# Patient Record
Sex: Male | Born: 1945
Health system: Southern US, Community
[De-identification: ages and names within clinical notes are randomized; demographics above are authoritative.]

## PROBLEM LIST (undated history)

## (undated) DIAGNOSIS — I10 Essential (primary) hypertension: Secondary | ICD-10-CM

## (undated) DIAGNOSIS — E785 Hyperlipidemia, unspecified: Secondary | ICD-10-CM

## (undated) DIAGNOSIS — N200 Calculus of kidney: Secondary | ICD-10-CM

## (undated) DIAGNOSIS — E119 Type 2 diabetes mellitus without complications: Secondary | ICD-10-CM

## (undated) DIAGNOSIS — I2510 Atherosclerotic heart disease of native coronary artery without angina pectoris: Secondary | ICD-10-CM

## (undated) DIAGNOSIS — M109 Gout, unspecified: Secondary | ICD-10-CM

## (undated) DIAGNOSIS — E039 Hypothyroidism, unspecified: Secondary | ICD-10-CM

## (undated) HISTORY — PX: CORONARY STENT PLACEMENT: SHX1402

## (undated) HISTORY — DX: Essential (primary) hypertension: I10

## (undated) HISTORY — DX: Type 2 diabetes mellitus without complications: E11.9

---

## 1999-03-16 ENCOUNTER — Emergency Department (HOSPITAL_COMMUNITY): Admission: EM | Admit: 1999-03-16 | Discharge: 1999-03-16 | Payer: Self-pay | Admitting: Emergency Medicine

## 2003-01-03 ENCOUNTER — Encounter: Payer: Self-pay | Admitting: Emergency Medicine

## 2003-01-03 ENCOUNTER — Inpatient Hospital Stay (HOSPITAL_COMMUNITY): Admission: EM | Admit: 2003-01-03 | Discharge: 2003-01-05 | Payer: Self-pay | Admitting: Emergency Medicine

## 2003-01-05 ENCOUNTER — Encounter (INDEPENDENT_AMBULATORY_CARE_PROVIDER_SITE_OTHER): Payer: Self-pay | Admitting: Cardiology

## 2009-10-12 ENCOUNTER — Encounter: Admission: RE | Admit: 2009-10-12 | Discharge: 2009-10-12 | Payer: Self-pay | Admitting: Emergency Medicine

## 2011-03-31 NOTE — H&P (Signed)
NAME:  Victor Hutchinson, Victor Hutchinson                        ACCOUNT NO.:  [id number]   MEDICAL RECORD NO.:  [id number]                  PATIENT TYPE:  INP   LOCATION:                                       FACILITY:  [facility]   PHYSICIAN:  Teena Irani. Arlyce Dice, M.D.               DATE OF BIRTH:  January 17, 1946   DATE OF ADMISSION:  01/03/2003  DATE OF DISCHARGE:                                HISTORY & PHYSICAL   CHIEF COMPLAINT:  I'm weak on my left side.   HISTORY OF PRESENT ILLNESS:  This 65 year old male was in his usual state of  health until about 30 hours ago.  At that time, he noted an episode where he  felt numbness on the left side of his body followed by numbness on the left  leg and it lasted a few minutes.  He actually went out to take the dog for a  walk.  He felt if he walked his numbness would go away and in fact it did.  He has had two more milder episodes since then, but then on the evening that  he presented to the emergency room, that is the evening of January 02, 2003, he had a severe episode and came here.  It has now dissipated.  He has  never had this before.  He has no history of cerebrovascular problems.  He  does have a history of hypertension for which he has been treated with  verapamil.  He has not taken it regularly because he does not like the way  it makes him feel.  The only other medicine he takes is Lipitor.  He had a  CT scan that showed a small possible CVA remotely in the frontal lobe.   PAST MEDICAL HISTORY:  Significant in that he had a tonsillectomy and  adenoidectomy in childhood.   ALLERGIES:  He has a possible PENICILLIN allergy.   SOCIAL HISTORY:  He does not smoke or use alcohol.  He has two grown  children and several grandchildren.  He is married.  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   REVIEW OF SYSTEMS:  Noncontributory for the current illness.   PHYSICAL EXAMINATION:   VITAL SIGNS:  Blood pressure 199/107, dropping to  147/97, pulse 88, respiratory rate 18, temperature 97.7 degrees, pulse  oximetry 96%.  GENERAL APPEARANCE:  He is alert, pleasant, and in no distress.  His wife is  present.  HEENT:  Normocephalic.  ACs and TMs clear.  PERRLA.  EOMs intact.  Fundi  benign.  Nares unobstructed.  No septal deviation.  Tongue uncoated.  The  uvula is in the midline.  Teeth are in good repair.  NECK:  No bruits are heard.  CHEST:  Expands symmetrically.  No wheezing, rhonchi, or rales.  HEART:  Normal size  clinically without murmurs, rubs, or gallops.  ABDOMEN:  Obese and soft.  No masses, guarding, tenderness, or organomegaly.  Bowel sounds are intact.  No bruits are heard.  Good peripheral pulses are  intact without edema or skin changes.  GENITALIA:  Deferred.  RECTAL:  Deferred.  CENTRAL NERVOUS SYSTEM:  Cranial nerves II-XII intact.  No gross motor or  sensory deficits.  Cerebellar function intact.  Deep tendon reflexes are  present, equal, and brisk.  SKIN:  Clear without lesions.   IMPRESSION:  1. Episodes of left-sided numbness of uncertain cause.  2. Hypertension.  3. Hyperlipidemia.   PLAN:  1. Admit.  2. Anticoagulate.  3. Assess.                                               Teena Irani. Arlyce Dice, M.D.    DMK/MEDQ  D:  01/03/2003  T:  01/03/2003  Job:  [id number]

## 2011-07-22 ENCOUNTER — Observation Stay (HOSPITAL_COMMUNITY)
Admission: EM | Admit: 2011-07-22 | Discharge: 2011-07-24 | Disposition: A | Discharge status: Home / Self Care | Payer: No Typology Code available for payment source | Source: Ambulatory Visit | Attending: Internal Medicine | Admitting: Internal Medicine

## 2011-07-22 ENCOUNTER — Emergency Department (HOSPITAL_COMMUNITY): Payer: No Typology Code available for payment source

## 2011-07-22 DIAGNOSIS — G459 Transient cerebral ischemic attack, unspecified: Principal | ICD-10-CM | POA: Insufficient documentation

## 2011-07-22 DIAGNOSIS — E781 Pure hyperglyceridemia: Secondary | ICD-10-CM | POA: Insufficient documentation

## 2011-07-22 DIAGNOSIS — E785 Hyperlipidemia, unspecified: Secondary | ICD-10-CM | POA: Insufficient documentation

## 2011-07-22 DIAGNOSIS — E039 Hypothyroidism, unspecified: Secondary | ICD-10-CM | POA: Insufficient documentation

## 2011-07-22 DIAGNOSIS — N183 Chronic kidney disease, stage 3 unspecified: Secondary | ICD-10-CM | POA: Insufficient documentation

## 2011-07-22 DIAGNOSIS — I129 Hypertensive chronic kidney disease with stage 1 through stage 4 chronic kidney disease, or unspecified chronic kidney disease: Secondary | ICD-10-CM | POA: Insufficient documentation

## 2011-07-22 DIAGNOSIS — IMO0001 Reserved for inherently not codable concepts without codable children: Secondary | ICD-10-CM | POA: Insufficient documentation

## 2011-07-22 DIAGNOSIS — Z8673 Personal history of transient ischemic attack (TIA), and cerebral infarction without residual deficits: Secondary | ICD-10-CM | POA: Insufficient documentation

## 2011-07-22 LAB — DIFFERENTIAL
Basophils Absolute: 0.1 10*3/uL (ref 0.0–0.1)
Eosinophils Absolute: 0.2 10*3/uL (ref 0.0–0.7)
Eosinophils Relative: 3 % (ref 0–5)
Lymphocytes Relative: 20 % (ref 12–46)
Lymphs Abs: 1.1 10*3/uL (ref 0.7–4.0)
Monocytes Relative: 9 % (ref 3–12)
Neutro Abs: 3.6 10*3/uL (ref 1.7–7.7)
Neutrophils Relative %: 68 % (ref 43–77)

## 2011-07-22 LAB — COMPREHENSIVE METABOLIC PANEL
ALT: 34 U/L (ref 0–53)
Albumin: 4 g/dL (ref 3.5–5.2)
Alkaline Phosphatase: 92 U/L (ref 39–117)
BUN: 24 mg/dL — ABNORMAL HIGH (ref 6–23)
Chloride: 103 mEq/L (ref 96–112)
GFR calc Af Amer: 56 mL/min — ABNORMAL LOW (ref >60)
Glucose, Bld: 207 mg/dL — ABNORMAL HIGH (ref 70–99)
Potassium: 4 mEq/L (ref 3.5–5.1)
Sodium: 139 mEq/L (ref 135–145)
Total Bilirubin: 0.2 mg/dL — ABNORMAL LOW (ref 0.3–1.2)

## 2011-07-22 LAB — CBC
Hemoglobin: 15 g/dL (ref 13.0–17.0)
MCH: 29 pg (ref 26.0–34.0)
MCV: 81.3 fL (ref 78.0–100.0)
RBC: 5.18 MIL/uL (ref 4.22–5.81)
WBC: 5.4 10*3/uL (ref 4.0–10.5)

## 2011-07-23 ENCOUNTER — Observation Stay (HOSPITAL_COMMUNITY): Payer: No Typology Code available for payment source

## 2011-07-23 DIAGNOSIS — I517 Cardiomegaly: Secondary | ICD-10-CM

## 2011-07-23 DIAGNOSIS — G459 Transient cerebral ischemic attack, unspecified: Secondary | ICD-10-CM

## 2011-07-23 LAB — HEMOGLOBIN A1C: Hgb A1c MFr Bld: 7.4 % — ABNORMAL HIGH (ref <5.7)

## 2011-07-23 LAB — GLUCOSE, CAPILLARY
Glucose-Capillary: 144 mg/dL — ABNORMAL HIGH (ref 70–99)
Glucose-Capillary: 113 mg/dL — ABNORMAL HIGH (ref 70–99)

## 2011-07-23 LAB — LIPID PANEL
HDL: 24 mg/dL — ABNORMAL LOW (ref >39)
LDL Cholesterol: UNDETERMINED mg/dL (ref 0–99)
Total CHOL/HDL Ratio: 8.4 RATIO

## 2011-07-23 LAB — CARDIAC PANEL(CRET KIN+CKTOT+MB+TROPI)
CK, MB: 2.5 ng/mL (ref 0.3–4.0)
Total CK: 48 U/L (ref 7–232)

## 2011-07-23 LAB — TSH: TSH: 12.013 u[IU]/mL — ABNORMAL HIGH (ref 0.350–4.500)

## 2011-07-23 NOTE — H&P (Signed)
NAMESERGE, MAIN NO.:  [id number]  MEDICAL RECORD NO.:  [id number]  LOCATION:  [facility]                         FACILITY:  [facility]  PHYSICIAN:  Seward Speck, MD        DATE OF BIRTH:  07-Nov-1946  DATE OF ADMISSION:  07/22/2011 DATE OF DISCHARGE:                             HISTORY & PHYSICAL   CHIEF COMPLAINT:  Blurry vision.  HISTORY OF PRESENT ILLNESS:  A 65 year old male with history of TIA in 2004 presenting as left upper extremity and lower extremity weakness and numbness, it completely resolved, he presented with the above complaint. The patient was in his normal state of health.  When eating dinner the night of admission, he developed bilateral double vision that became blurry vision.  This lasted several seconds and resolved completely, it then recurred, it then lasted several seconds and then resolved completely.  Currently, he is asymptomatic.  During these spells, he did not have any loss of vision, he did not have any slurred speech, loss of speech, trouble going to use words or weakness or numbness or paresthesias in any extremity.  He felt completely in his normal state of health.  Denied any lightheadedness, syncope, palpitations or any other symptoms during these spells.  He has not had these spells before. His last eye exam was 2 years ago.  He has not had any other ocular symptoms such as eye pain, itchy watery eyes or eye discharge, currently he is asymptomatic.  Several years ago in 2004, he was admitted for workup of a presumed TIA causing left upper extremity and lower extremities symptoms that resolved.  He did not have recurrence of these.  At that time, he had an MRI and MRA of his brain that showed some small vessel disease and negative for any concerning findings on the MRA.  The patient checked his blood sugar and at the time of these symptoms it was in the 140s.  The patient took baby aspirin prior to coming to the emergency  room.  PAST MEDICAL HISTORY: 1. Hypertension, currently on verapamil as monotherapy.  He was on     lisinopril in the past, but was told to stop this at some point for     unknown reasons.  He did not have any side effects of this     medicine that he recalls.  He states his blood pressure home typically runs     150s/80s. 2. Diabetes mellitus type 2 with no documented complications, although     he does have an elevated creatinine suggesting he may have some     diabetic nephropathy. 3. Hyperlipidemia.  The patient was on Lipitor in the past, but has     stopped this because of severe myalgias.  He has not been tried on     another statin. 4. History of TIA in 2004, and is as detailed above.  FAMILY HISTORY:  Lung cancer run in his family, but most of the family are smokers.  SOCIAL HISTORY:  He lives with his wife.  He currently repairs rebuild engines for a job.  He denies tobacco or alcohol use.  REVIEW OF SYSTEMS:  Twelve point review  of systems was reviewed with the patient and is as per HPI, otherwise negative.  ALLERGIES:  PENICILLIN.  MEDICATIONS: 1. Verapamil 240 sustained release once daily. 2. Synthroid unknown dose. 3. As noted above, he had been on lisinopril and Lipitor in the past,     but has stopped these for the reasons stated above.  In addition,     he had been on Plavix in the past, but stopped several years ago as     well for reasons unknown.  Code status: Full  PHYSICAL EXAMINATION:  VITAL SIGNS:  Afebrile, blood pressure currently 168/69, when he came here it was 193/94, pulse 60, respiratory rate 16, oxygen saturation 96% on room air. GENERAL/CONSTITUTIONAL:  Well-developed, overweight male in no acute distress. HEENT:  Moist mucous membranes. NECK:  Supple.  No JVD.  No carotid bruits bilaterally. CARDIOVASCULAR:  Regular rate and rhythm.  No murmurs.  Heart tones soft. PULMONARY:  Clear to auscultation bilaterally. ABDOMEN:  Soft,  nontender. EXTREMITIES:  Warm and well perfused.  No cyanosis, clubbing or edema x4.  Normal pulses throughout. SKIN:  No rash. PSYCH:  Appropriate. NEUROLOGIC:  Alert and oriented x3.  His cranial nerves are intact including normal extraocular movements and pupils are reactive to light bilaterally.  His tongue deviates in the midline.  Facial muscles are normal.  Sensation normal.  His motor exam is 5/5 throughout.  He has no obvious sensory deficits.  His speech is normal.  DIAGNOSTIC STUDIES:  The patient had an EKG, normal sinus rhythm, early transition, first degree AV block, nonspecific T-wave change in aVL. QTC is high normal.  BMP shows creatinine 1.5, BUN 24.  Last creatinine we have is 1.3, this was from 2004; however, troponin negative.  Glucose 207.  CBC normal.  Head CT shows no acute intracranial process likely small chronic lacunar infarct within the anterior aspect of the right external capsule.  ASSESSMENT: 1. Bilateral acute blurry vision, which resolved spontaneously     possibly consistent with transient ischemic attack, although this     will be a slightly odd presentation. 2. Hypertension, currently uncontrolled. 3. Diabetes mellitus type 2, currently uncontrolled. 4. Hyperlipidemia, control unknown. 5. History of transient ischemic attack in the past. 6. History of severe myalgias to Lipitor. 7. CKD II, likely from diabetic nephropathy  PLAN:  We will admit the patient to observation.  We will put him on telemetry to rule out any A Fib as the cause of his possible TIA.  I doubt if MRI/MRA will be helpful in him as he has not had a full stroke and he has had an MRA in the past that showed no vascular abnormality and we are mostly concerned about amaurosis fugax type event thus, we will obtain just carotid Doppler and 2-D echocardiogram for further workup, I think this will be adequate.  We will use ambulation for venous thromboembolism prophylaxis.  We will  put him on sliding scale insulin of moderate intensity.  We will continue his verapamil.  We will start him on lisinopril 10 mg once daily as this will be a good drug for him give his diabetes, likely diabetic nephropathy and poorly controlled hypertension.  We will also place him on Pravachol 40 mg once daily. Again, this is to try different statin and Lipitor as he has had severe myalgias to Lipitor in the past, but the statin is a very good drug for him given his diabetes and history of transient ischemic attacks, it will be  in his best interest to attempt to get another statin as he could tolerate.  We will check a lipid panel as well as an A1c.  We will also check a TSH as people with active hypothyroidism are having increase susceptibility to statin-induced myalgias, thus it will be important to get him euthyroid prior to starting statin to minimize the possibility of myalgias, which may determine from taking this medicine in the future.  I do not know what his current Synthroid dose, neither does he, thus I will not start him on thyroid placement.  Please note that, he does take this chronically and if the dose can be determined, it should be started for him as soon as possible on a daily basis.  We will also check urine microalbumin level in regard to ascertain for any endo-organ damage from his diabetes, so I suspect he has diabetic nephropathy.  We will place him on a baby aspirin.  We will do this instead of Plavix as he was on Plavix in the past, but this was stopped for unknown reasons, thus I am unclear if he had a reaction to Plavix in the past.  Begin on a baby aspirin is a good start for him given the possible diagnosis of acute TIA and the definite history of a prior TIA.  Plavix is a good alternative if it is determined this is the safe drug given his history of being taken off this for unknown reasons.  He will be a full code per his request.  He did confirm he did not  want any prolonged life support.          ______________________________ Seward Speck, MD     DP/MEDQ  D:  07/23/2011  T:  07/23/2011  Job:  [id number]  Electronically Signed by Seward Speck MD on 07/23/2011 03:40:05 AM

## 2011-07-24 LAB — BASIC METABOLIC PANEL
BUN: 22 mg/dL (ref 6–23)
CO2: 24 mEq/L (ref 19–32)
Calcium: 9.5 mg/dL (ref 8.4–10.5)
Chloride: 104 mEq/L (ref 96–112)
Creatinine, Ser: 1.36 mg/dL — ABNORMAL HIGH (ref 0.50–1.35)
Glucose, Bld: 176 mg/dL — ABNORMAL HIGH (ref 70–99)

## 2011-07-24 LAB — GLUCOSE, CAPILLARY: Glucose-Capillary: 166 mg/dL — ABNORMAL HIGH (ref 70–99)

## 2011-08-11 NOTE — Discharge Summary (Signed)
NAMEJONERIC, Victor Hutchinson              ACCOUNT NO.:  [id number]  MEDICAL RECORD NO.:  [id number]  LOCATION:  3016                         FACILITY:  [facility]  PHYSICIAN:  Richarda Overlie, MD       DATE OF BIRTH:  Oct 19, 1946  DATE OF ADMISSION:  07/22/2011 DATE OF DISCHARGE:  07/24/2011                              DISCHARGE SUMMARY   PRIMARY CARE PHYSICIAN:  Dr. Andrey Campanile.  DISCHARGE DIAGNOSES: 1. Transient ischemic attack. 2. Hypothyroidism. 3. Uncontrolled type 2 diabetes. 4. Uncontrolled hypertension. 5. Hypertriglyceridemia.  DISCHARGE MEDICATIONS: 1. Aspirin 81 mg p.o. daily. 2. Glipizide 5 mg p.o. daily. 3. Lisinopril 20 mg p.o. daily. 4. Omega-3 fatty acid 4 g p.o. daily. 5. Crestor 10 mg p.o. daily. 6. Synthroid 25 mcg p.o. daily. 7. Verapamil SR 240 mg p.o. daily.  SUBJECTIVE:  This is a 65 year old male who presents with symptoms of double vision with a prior history of CVA.  The patient's symptoms completely resolved upon presentation to the ER.  He denied any chest pain, shortness of breath, any lightheadedness.  He was admitted for further evaluation.  HOSPITAL COURSE: 1. TIA.  The patient was found to have uncontrolled hypertension, was     evaluated for risk factors and his lipid panel showed that his     triglycerides were 552, his LDL was unable to be calculated because     his triglycerides were over 400 and his HDL was 24.  His hemoglobin     A1c was found to be 7.4.  His initial CT of the head without     contrast did not show any acute intracranial pathology, but showed     small chronic lacunar infarcts within the anterior aspect of the     right external capsule.  MRI of the brain did not show any acute     findings, but he was found to have a small old left posterior     parietal cortical infarction.  A 2-D echo was done, the results of     which are pending at this time.  The patient was found to be normal     sinus rhythm without any underlying  arrhythmias.  He used to be on     Plavix which he stopped several years ago.  The patient is being     resumed on a baby aspirin.  He has been counseled about risk factor     modification. 2. Hypertension.  The patient will require an ACE inhibitor as well as     verapamil for his hypertension.  His blood pressure was found to be     in the 190s systolic upon presentation.  He would need a repeat     BMET in about 1-2 weeks to ensure that his creatinine and his     potassium are stable. 3. Dyslipidemia.  The patient was found to have hypertriglyceridemia     and has been started on Crestor and Lovaza.  He was intolerant of     Lipitor previously.  He would need a repeat lipid panel in about 6     weeks. 4. Hypothyroidism.  The patient was found to have clinical signs  and     symptoms of hypothyroidism.  His free T4 is still pending at this     time.  His TSH was found to be 12.013.  He will be restarted on     Synthroid 25 mcg and he will have a recheck of his TSH thyroid     function in about 6 weeks.  PHYSICAL EXAMINATION:  VITAL SIGNS:  Blood pressure 172/71, respirations 18, pulse 66, temperature 98.0, 96% on room air. GENERAL:  Currently comfortable in no acute cardiopulmonary distress. HEENT:  Pupils equal and reactive.  Extraocular movements intact. NECK:  Supple.  No JVD. LUNGS:  Clear to auscultation bilaterally.  No wheezes, no crackles or rhonchi. CARDIOVASCULAR:  Regular rate and rhythm.  No murmurs, rubs, or gallops. ABDOMEN:  Soft, nontender, nondistended. EXTREMITIES:  Without cyanosis, clubbing, or edema. NEUROLOGIC:  Cranial nerves II through XII grossly intact. PSYCHIATRIC:  Appropriate mood and affect.  FOLLOWUP:  TSH, lipid panel in about 6 weeks.  Follow up with PCP in 5-7 days.     Richarda Overlie, MD     NA/MEDQ  D:  07/24/2011  T:  07/24/2011  Job:  [id number]  cc:   Dr. Andrey Campanile  Electronically Signed by Richarda Overlie MD on 08/11/2011 07:15:26 AM

## 2013-06-06 ENCOUNTER — Ambulatory Visit (INDEPENDENT_AMBULATORY_CARE_PROVIDER_SITE_OTHER): Payer: PRIVATE HEALTH INSURANCE | Admitting: Emergency Medicine

## 2013-06-06 ENCOUNTER — Encounter (HOSPITAL_COMMUNITY): Payer: Self-pay | Admitting: *Deleted

## 2013-06-06 ENCOUNTER — Inpatient Hospital Stay (HOSPITAL_COMMUNITY): Payer: No Typology Code available for payment source

## 2013-06-06 ENCOUNTER — Ambulatory Visit: Payer: PRIVATE HEALTH INSURANCE

## 2013-06-06 ENCOUNTER — Inpatient Hospital Stay (HOSPITAL_COMMUNITY)
Admission: AD | Admit: 2013-06-06 | Discharge: 2013-06-08 | DRG: 690 | Disposition: A | Discharge status: Home / Self Care | Payer: No Typology Code available for payment source | Source: Ambulatory Visit | Attending: Family Medicine | Admitting: Family Medicine

## 2013-06-06 VITALS — BP 122/68 | HR 80 | Temp 98.0°F | Resp 16 | Ht 70.0 in | Wt 202.0 lb

## 2013-06-06 DIAGNOSIS — Z87440 Personal history of urinary (tract) infections: Secondary | ICD-10-CM

## 2013-06-06 DIAGNOSIS — R300 Dysuria: Secondary | ICD-10-CM

## 2013-06-06 DIAGNOSIS — N200 Calculus of kidney: Secondary | ICD-10-CM

## 2013-06-06 DIAGNOSIS — N138 Other obstructive and reflux uropathy: Secondary | ICD-10-CM

## 2013-06-06 DIAGNOSIS — I10 Essential (primary) hypertension: Secondary | ICD-10-CM

## 2013-06-06 DIAGNOSIS — N401 Enlarged prostate with lower urinary tract symptoms: Secondary | ICD-10-CM

## 2013-06-06 DIAGNOSIS — E785 Hyperlipidemia, unspecified: Secondary | ICD-10-CM | POA: Diagnosis present

## 2013-06-06 DIAGNOSIS — N390 Urinary tract infection, site not specified: Secondary | ICD-10-CM

## 2013-06-06 DIAGNOSIS — Z87442 Personal history of urinary calculi: Secondary | ICD-10-CM

## 2013-06-06 DIAGNOSIS — I129 Hypertensive chronic kidney disease with stage 1 through stage 4 chronic kidney disease, or unspecified chronic kidney disease: Secondary | ICD-10-CM | POA: Diagnosis present

## 2013-06-06 DIAGNOSIS — M109 Gout, unspecified: Secondary | ICD-10-CM

## 2013-06-06 DIAGNOSIS — E119 Type 2 diabetes mellitus without complications: Secondary | ICD-10-CM | POA: Diagnosis present

## 2013-06-06 DIAGNOSIS — E039 Hypothyroidism, unspecified: Secondary | ICD-10-CM | POA: Diagnosis present

## 2013-06-06 DIAGNOSIS — N183 Chronic kidney disease, stage 3 unspecified: Secondary | ICD-10-CM | POA: Diagnosis present

## 2013-06-06 DIAGNOSIS — N289 Disorder of kidney and ureter, unspecified: Secondary | ICD-10-CM

## 2013-06-06 DIAGNOSIS — I119 Hypertensive heart disease without heart failure: Secondary | ICD-10-CM | POA: Diagnosis present

## 2013-06-06 HISTORY — DX: Calculus of kidney: N20.0

## 2013-06-06 HISTORY — DX: Hyperlipidemia, unspecified: E78.5

## 2013-06-06 HISTORY — DX: Gout, unspecified: M10.9

## 2013-06-06 HISTORY — DX: Hypothyroidism, unspecified: E03.9

## 2013-06-06 LAB — COMPREHENSIVE METABOLIC PANEL
ALT: 9 U/L (ref 0–53)
AST: 15 U/L (ref 0–37)
AST: 12 U/L (ref 0–37)
Albumin: 4 g/dL (ref 3.5–5.2)
Albumin: 3.4 g/dL — ABNORMAL LOW (ref 3.5–5.2)
Alkaline Phosphatase: 85 U/L (ref 39–117)
Calcium: 9.5 mg/dL (ref 8.4–10.5)
Calcium: 9.6 mg/dL (ref 8.4–10.5)
Chloride: 104 mEq/L (ref 96–112)
Creatinine, Ser: 1.62 mg/dL — ABNORMAL HIGH (ref 0.50–1.35)
Glucose, Bld: 108 mg/dL — ABNORMAL HIGH (ref 70–99)
Potassium: 4.1 mEq/L (ref 3.5–5.3)
Sodium: 138 mEq/L (ref 135–145)
Sodium: 137 mEq/L (ref 135–145)
Total Protein: 7.5 g/dL (ref 6.0–8.3)

## 2013-06-06 LAB — GRAM STAIN

## 2013-06-06 LAB — CBC WITH DIFFERENTIAL/PLATELET
Eosinophils Absolute: 0 10*3/uL (ref 0.0–0.7)
Eosinophils Relative: 0 % (ref 0–5)
HCT: 38.5 % — ABNORMAL LOW (ref 39.0–52.0)
Hemoglobin: 13.2 g/dL (ref 13.0–17.0)
Lymphs Abs: 0.6 10*3/uL — ABNORMAL LOW (ref 0.7–4.0)
MCH: 27.3 pg (ref 26.0–34.0)
MCV: 79.7 fL (ref 78.0–100.0)
Monocytes Absolute: 1.6 10*3/uL — ABNORMAL HIGH (ref 0.1–1.0)
Monocytes Relative: 9 % (ref 3–12)
RBC: 4.83 MIL/uL (ref 4.22–5.81)

## 2013-06-06 LAB — POCT URINALYSIS DIPSTICK
Glucose, UA: NEGATIVE
Ketones, UA: 15
Nitrite, UA: NEGATIVE
Protein, UA: 30
Spec Grav, UA: 1.02
Urobilinogen, UA: 1
pH, UA: 6

## 2013-06-06 LAB — URINE MICROSCOPIC-ADD ON

## 2013-06-06 LAB — POCT UA - MICROSCOPIC ONLY
Crystals, Ur, HPF, POC: NEGATIVE
Mucus, UA: NEGATIVE
Yeast, UA: NEGATIVE

## 2013-06-06 LAB — URINALYSIS, ROUTINE W REFLEX MICROSCOPIC
Bilirubin Urine: NEGATIVE
Glucose, UA: NEGATIVE mg/dL
Nitrite: POSITIVE — AB
Specific Gravity, Urine: 1.018 (ref 1.005–1.030)
pH: 6.5 (ref 5.0–8.0)

## 2013-06-06 LAB — POCT CBC
Granulocyte percent: 88.9 %G — AB (ref 37–80)
HCT, POC: 43.6 % (ref 43.5–53.7)
Hemoglobin: 13.8 g/dL — AB (ref 14.1–18.1)
Lymph, poc: 0.9 (ref 0.6–3.4)
MCV: 85.3 fL (ref 80–97)
POC Granulocyte: 14.8 — AB (ref 2–6.9)

## 2013-06-06 LAB — GLUCOSE, POCT (MANUAL RESULT ENTRY): POC Glucose: 102 mg/dl — AB (ref 70–99)

## 2013-06-06 LAB — URIC ACID: Uric Acid, Serum: 7.8 mg/dL (ref 4.0–7.8)

## 2013-06-06 LAB — GLUCOSE, CAPILLARY: Glucose-Capillary: 157 mg/dL — ABNORMAL HIGH (ref 70–99)

## 2013-06-06 MED ORDER — HEPARIN SODIUM (PORCINE) 5000 UNIT/ML IJ SOLN
5000.0000 [IU] | Freq: Three times a day (TID) | INTRAMUSCULAR | Status: DC
  Administered 2013-06-06 – 2013-06-08 (×5): 5000 [IU] via SUBCUTANEOUS
  Filled 2013-06-06 (×8): qty 1

## 2013-06-06 MED ORDER — DOCUSATE SODIUM 100 MG PO CAPS
100.0000 mg | ORAL_CAPSULE | Freq: Two times a day (BID) | ORAL | Status: DC
  Administered 2013-06-06 – 2013-06-07 (×2): 100 mg via ORAL
  Filled 2013-06-06 (×3): qty 1

## 2013-06-06 MED ORDER — LEVOTHYROXINE SODIUM 25 MCG PO TABS
25.0000 ug | ORAL_TABLET | Freq: Every day | ORAL | Status: DC
  Administered 2013-06-07 – 2013-06-08 (×2): 25 ug via ORAL
  Filled 2013-06-06 (×3): qty 1

## 2013-06-06 MED ORDER — DEXTROSE 5 % IV SOLN: 1.0000 g | INTRAVENOUS | Status: DC

## 2013-06-06 MED ORDER — MORPHINE SULFATE 4 MG/ML IJ SOLN
4.0000 mg | INTRAMUSCULAR | Status: DC | PRN
  Administered 2013-06-06: 4 mg via INTRAVENOUS
  Filled 2013-06-06: qty 1

## 2013-06-06 MED ORDER — DEXTROSE 5 % IV SOLN
1.0000 g | INTRAVENOUS | Status: DC
  Administered 2013-06-06 – 2013-06-07 (×2): 1 g via INTRAVENOUS
  Filled 2013-06-06 (×4): qty 10

## 2013-06-06 MED ORDER — INSULIN ASPART 100 UNIT/ML ~~LOC~~ SOLN
0.0000 [IU] | Freq: Three times a day (TID) | SUBCUTANEOUS | Status: DC
  Administered 2013-06-07: 1 [IU] via SUBCUTANEOUS
  Administered 2013-06-07: 2 [IU] via SUBCUTANEOUS
  Administered 2013-06-07 – 2013-06-08 (×2): 1 [IU] via SUBCUTANEOUS

## 2013-06-06 MED ORDER — ONDANSETRON HCL 4 MG/2ML IJ SOLN
4.0000 mg | Freq: Four times a day (QID) | INTRAMUSCULAR | Status: DC | PRN
  Administered 2013-06-07: 4 mg via INTRAVENOUS
  Filled 2013-06-06: qty 2

## 2013-06-06 MED ORDER — VERAPAMIL HCL ER 240 MG PO TBCR
240.0000 mg | EXTENDED_RELEASE_TABLET | Freq: Every morning | ORAL | Status: DC
Start: 2013-06-07 — End: 2013-06-08
  Administered 2013-06-07 – 2013-06-08 (×2): 240 mg via ORAL
  Filled 2013-06-06 (×2): qty 1

## 2013-06-06 MED ORDER — SODIUM CHLORIDE 0.45 % IV SOLN
INTRAVENOUS | Status: DC
  Administered 2013-06-07 – 2013-06-08 (×3): via INTRAVENOUS

## 2013-06-06 MED ORDER — ONDANSETRON HCL 4 MG PO TABS: 4.0000 mg | ORAL_TABLET | Freq: Four times a day (QID) | ORAL | Status: DC | PRN

## 2013-06-06 MED ORDER — PROBENECID 500 MG PO TABS
250.0000 mg | ORAL_TABLET | Freq: Two times a day (BID) | ORAL | Status: DC
  Administered 2013-06-06 – 2013-06-08 (×4): 250 mg via ORAL
  Filled 2013-06-06 (×5): qty 1

## 2013-06-06 MED ORDER — ACETAMINOPHEN 325 MG PO TABS
650.0000 mg | ORAL_TABLET | Freq: Four times a day (QID) | ORAL | Status: DC | PRN
  Administered 2013-06-07: 650 mg via ORAL
  Filled 2013-06-06: qty 2

## 2013-06-06 MED ORDER — DEXTROSE 5 % IV SOLN: 2.0000 g | INTRAVENOUS | Status: DC

## 2013-06-06 NOTE — H&P (Signed)
Family Medicine Teaching [facility] Admission History and Physical Service Pager: [phone]  Patient name: Victor Hutchinson Medical record number: [id number] Date of birth: Oct 16, 1946 Age: 67 y.o. Gender: male  Primary Care Provider: Dr. Benedetto Goad in [place] Consultants: None Code Status: Full  Chief Complaint: Dysuria, weakness and fever.   Assessment and Plan: Victor Hutchinson is a 67 y.o. year old male presenting with dysuria and urinary urgency x2 days. PMH is significant for kidney stones, HTN, and DM, and gout.      Urinary tract infection U/A in clinic: WBC TNTC/HPF and bacteria 3+. WBCs 16.6 in clinic. Currently afebrile, VSS wnl. Previous UTI treated with cipro po approximately 4 years ago. - U/A, urine culture with gram stain.  - Starting Rocephin 1g daily.   Nephrolithiasis Abd XR with left abdominal densities suspicious for nephrolithiasis.  Pt still with severe suprapubic pain. Possibly pain simply due to UTI, though with WBC 16k, concern for development of sepsis from nephrolithiasis though does not meet SIRS criteria at present.  - CT Abdomen w/o contrast to further assess size/location of stones. If obstructing or >67mm, will consult urology.  CT also to evaluate for other etiology including appendicitis though benign abdominal exam.  - 1/2NS @ 125cc/hr - Morphine 4mg  q4h prn pain - ? Baseline kidney function with last known Cr 2.86. Attempted to access careeverywhere for [facility] [place] and unsuccessful. BMET ordered.  -would consider flomax  Type II diabetes mellitus Treats diabetes with diet and glipizide. Metformin has not been used because of kidney function reportedly. [ ]  Hb A1c - Start sensitive SSI. Hold glipizide.  - Carb modified diet   Gout Continue home probenecid. [ ]  Uric acid level  HTN Continue home verapamil, this may be therapeutic for stone passage.   Hyperlipidemia Could not tolerate lipitor or crestor.  [ ]  Lipid panel - Will  consider more mild statin if indicated.   Hypothyroidism continue levothyroxine.   FEN/GI: 1/2NS @100cc /hr, Carb modified Prophylaxis: Heparin subcutaneous  Disposition: On Med-surg floor pending further evaluation/treatment  History of Present Illness: Victor Hutchinson is a 67 y.o. year old male presented to [facility] (PCP: [place], [place]) with 2 days of urinary urgency and dysuria assoc w/ weakness and subjective fevers suspicious for kidney stone/pyelonephritis. Dr. Cleta Alberts had the patient transferred to hospital for further care.   Pain is mostly suprapubic and associated with nausea without emesis, no groin/abd pain, no CVA or back pain. Denies any decreased po intake recently. He has noticed no gross blood in his urine.  He states he has had this same feeling at another time when he had a kidney stone, at which time he was treated for UTI with cipro as an outpatient.  He has seen a urologist once with no intervention or follow-up recommended at that time. He remembers passing the stone at that time. Does not recall any dietary advice to prevent future stone formations.  Symptoms gradually progressive. Generalized weakness was noted today.    Review Of Systems: Per HPI. Otherwise 12 point review of systems was performed and was unremarkable.  Patient Active Problem List   Diagnosis Date Noted  . UTI (urinary tract infection) 06/06/2013  . Nephrolithiasis 06/06/2013  . Type II diabetes mellitus 06/06/2013  . Essential hypertension, benign 06/06/2013  . Other and unspecified hyperlipidemia 06/06/2013   Past Medical History: Past Medical History  Diagnosis Date  . Diabetes mellitus without complication   . Hypertension   . Hypothyroidism   .  Gout 06/06/2013  . Nephrolithiasis 06/06/2013  . Other and unspecified hyperlipidemia 06/06/2013   Past Surgical History: Only tonsillectomy. No appendectomy or cholecystectomy.  Social History: History  Substance Use Topics  . Smoking  status: Never Smoker   . Smokeless tobacco: Not on file  . 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   Family History: Family History  Problem Relation Age of Onset  . Heart disease Sister   Sister had CABG at 42yo.   Allergies and Medications: No Known Allergies No current facility-administered medications on file prior to encounter.   Current Outpatient Prescriptions on File Prior to Encounter  Medication Sig Dispense Refill  . glipiZIDE (GLUCOTROL) 5 MG tablet Take 5 mg by mouth 2 (two) times daily before a meal.      . levothyroxine (LEVOTHROID) 25 MCG tablet Take 25 mcg by mouth daily before breakfast.      . naproxen sodium (ANAPROX) 220 MG tablet Take 220 mg by mouth 2 (two) times daily with a meal.      . probenecid (BENEMID) 500 MG tablet Take 500 mg by mouth 2 (two) times daily.      . verapamil (CALAN-SR) 240 MG CR tablet Take 240 mg by mouth at bedtime.        Objective: BP 148/74  Pulse 81  Temp(Src) 99.7 F (37.6 C) (Oral)  Resp 18  Ht 5\' 10"  (1.778 m)  Wt 197 lb 1.5 oz (89.4 kg)  BMI 28.28 kg/m2  SpO2 98% Exam: Gen: 67yo male in NAD laying still.  HEENT: [place]/AT Dentures on hard palate CV: RRR no mrg  Lungs: CTAB  Abd: soft/nontender/nondistended/normal bowel sounds  MSK: moves all extremities, no edema  Skin: warm and dry, no rash, + Vitiligo Neuro: CN II-XII intact, sensation and reflexes normal throughout, 5/5 muscle strength in bilateral upper and lower extremities. Normal finger to nose. Normal rapid alternating movements.   Labs and Imaging: CBC BMET   Recent Labs Lab 06/06/13 1459  WBC 16.6*  HGB 13.8*  HCT 43.6   No results found for this basename: NA, K, CL, CO2, BUN, CREATININE, GLUCOSE, CALCIUM,  in the last 168 hours    06/06/2013 14:52  Color, UA yellow  Specific Gravity, UA 1.020  pH, UA 6.0  Glucose neg  Bilirubin, UA small  Ketones, UA 15  Clarity, UA cloudy  Protein, UA 30   Urobilinogen, UA 1.0  Nitrite, UA neg  Leukocytes, UA large (3+)  RBC, UA small  Mucus, UA neg  Yeast, UA neg  Epithelial cells, urine per micros 0-3  Casts, Ur, LPF, POC white  Crystals, Ur, HPF, POC neg  WBC, Ur, HPF, POC tntc  Bacteria, U Microscopic 3+  RBC, urine, microscopic tntc   7/25: Abd XR Findings: There are two oval calcific densities overlying the left  renal shadow. The larger more superior calcification measures 11  mm in diameter. No calcifications are seen over the right kidney  or expected course of the ureters. There is a possible small left  upper quadrant calcification which could be related to the spleen.  The bowel gas pattern is normal. The osseous structures appear  normal.  IMPRESSION:  Left abdominal densities suspicious for nephrolithiasis.   Hazeline Junker, MD 06/06/2013, 3:35 PM PGY-1, [facility] Family Medicine FPTS Intern pager: [phone], text pages welcome  Family Medicine Upper Level Addendum:   I have seen and examined the patient independently, discussed with Dr. Jarvis Newcomer,  fully reviewed the H+P and agree with it's contents with the additions as noted in blue text.    Tana Conch, MD, PGY3 06/06/2013 8:53 PM

## 2013-06-06 NOTE — Progress Notes (Signed)
Subjective:    Patient ID: Victor Hutchinson, male    DOB: 1946-08-13, 67 y.o.   MRN: [id number]  HPI patient states that on Wednesday he started having burning on urination he also has had the urge to urinate. He states he does not feel he empties his bladder completely. He has been weak and tired but no definite fever. Patient has a history of diabetes and gout. He has a history of kidney stones. He states he feels like he has in the past when he has had stone.    Review of Systems he has a history of diabetes     Objective:   Physical Exam patient is alert and cooperative in no distress. Neck supple chest clear heart regular rate no murmurs. Abdomen is flat liver and spleen not large. There is suprapubic tenderness. There is no rebound Results for orders placed in visit on 06/06/13  POCT URINALYSIS DIPSTICK      Result Value Range   Color, UA yellow     Clarity, UA cloudy     Glucose, UA neg     Bilirubin, UA small     Ketones, UA 15     Spec Grav, UA 1.020     Blood, UA small     pH, UA 6.0     Protein, UA 30     Urobilinogen, UA 1.0     Nitrite, UA neg     Leukocytes, UA large (3+)    POCT UA - MICROSCOPIC ONLY      Result Value Range   WBC, Ur, HPF, POC tntc     RBC, urine, microscopic tntc     Bacteria, U Microscopic 3+     Mucus, UA neg     Epithelial cells, urine per micros 0-3     Crystals, Ur, HPF, POC neg     Casts, Ur, LPF, POC white     Yeast, UA neg    POCT CBC      Result Value Range   WBC 16.6 (*) 4.6 - 10.2 K/uL   Lymph, poc 0.9  0.6 - 3.4   POC LYMPH PERCENT 5.6 (*) 10 - 50 %L   MID (cbc) 0.9  0 - 0.9   POC MID % 5.5  0 - 12 %M   POC Granulocyte 14.8 (*) 2 - 6.9   Granulocyte percent 88.9 (*) 37 - 80 %G   RBC 5.11  4.69 - 6.13 M/uL   Hemoglobin 13.8 (*) 14.1 - 18.1 g/dL   HCT, POC 86.5  78.4 - 53.7 %   MCV 85.3  80 - 97 fL   MCH, POC 27.0  27 - 31.2 pg   MCHC 31.7 (*) 31.8 - 35.4 g/dL   RDW, POC 69.6     Platelet Count, POC 217  142 - 424 K/uL   MPV 7.4  0 - 99.8 fL  GLUCOSE, POCT (MANUAL RESULT ENTRY)      Result Value Range   POC Glucose 102 (*) 70 - 99 mg/dl   Results for orders placed in visit on 06/06/13  POCT URINALYSIS DIPSTICK      Result Value Range   Color, UA yellow     Clarity, UA cloudy     Glucose, UA neg     Bilirubin, UA small     Ketones, UA 15     Spec Grav, UA 1.020     Blood, UA small     pH, UA 6.0  Protein, UA 30     Urobilinogen, UA 1.0     Nitrite, UA neg     Leukocytes, UA large (3+)    POCT UA - MICROSCOPIC ONLY      Result Value Range   WBC, Ur, HPF, POC tntc     RBC, urine, microscopic tntc     Bacteria, U Microscopic 3+     Mucus, UA neg     Epithelial cells, urine per micros 0-3     Crystals, Ur, HPF, POC neg     Casts, Ur, LPF, POC white     Yeast, UA neg    POCT CBC      Result Value Range   WBC 16.6 (*) 4.6 - 10.2 K/uL   Lymph, poc 0.9  0.6 - 3.4   POC LYMPH PERCENT 5.6 (*) 10 - 50 %L   MID (cbc) 0.9  0 - 0.9   POC MID % 5.5  0 - 12 %M   POC Granulocyte 14.8 (*) 2 - 6.9   Granulocyte percent 88.9 (*) 37 - 80 %G   RBC 5.11  4.69 - 6.13 M/uL   Hemoglobin 13.8 (*) 14.1 - 18.1 g/dL   HCT, POC 21.3  08.6 - 53.7 %   MCV 85.3  80 - 97 fL   MCH, POC 27.0  27 - 31.2 pg   MCHC 31.7 (*) 31.8 - 35.4 g/dL   RDW, POC 57.8     Platelet Count, POC 217  142 - 424 K/uL   MPV 7.4  0 - 99.8 fL  GLUCOSE, POCT (MANUAL RESULT ENTRY)      Result Value Range   POC Glucose 102 (*) 70 - 99 mg/dl   [facility] reading (PRIMARY) by  Dr. Cleta Alberts there are 2 calcific densities in the left kidney consistent with stones       Assessment & Plan:  Patient has significant elevation in his white count. His last BUN and creatinine were significantly  with a creatinine of 2.86 on 08/07/2010 . He does have significant gout with multiple tophi on his elbows and hands. He had chills last night I'm concerned he is headed toward urosepsis

## 2013-06-07 DIAGNOSIS — N401 Enlarged prostate with lower urinary tract symptoms: Secondary | ICD-10-CM

## 2013-06-07 DIAGNOSIS — N138 Other obstructive and reflux uropathy: Secondary | ICD-10-CM

## 2013-06-07 LAB — BASIC METABOLIC PANEL
BUN: 26 mg/dL — ABNORMAL HIGH (ref 6–23)
Chloride: 102 mEq/L (ref 96–112)
Creatinine, Ser: 1.68 mg/dL — ABNORMAL HIGH (ref 0.50–1.35)
GFR calc Af Amer: 47 mL/min — ABNORMAL LOW (ref >90)

## 2013-06-07 LAB — HEMOGLOBIN A1C: Mean Plasma Glucose: 148 mg/dL — ABNORMAL HIGH (ref <117)

## 2013-06-07 LAB — CBC
HCT: 36.3 % — ABNORMAL LOW (ref 39.0–52.0)
MCHC: 33.3 g/dL (ref 30.0–36.0)
MCV: 80.3 fL (ref 78.0–100.0)
RDW: 14.9 % (ref 11.5–15.5)

## 2013-06-07 LAB — GLUCOSE, CAPILLARY

## 2013-06-07 MED ORDER — TAMSULOSIN HCL 0.4 MG PO CAPS
0.4000 mg | ORAL_CAPSULE | Freq: Every day | ORAL | Status: DC
  Administered 2013-06-07: 0.4 mg via ORAL
  Filled 2013-06-07 (×2): qty 1

## 2013-06-07 NOTE — Progress Notes (Signed)
Seen and examined.  Discussed with Dr. Durene Cal.  Agree with management.  Reportedly, PVR normal.  But he was having urinary hesitancy and decreased stream prior to infection.  It sounds like he will benefit from flomax, which he has taken in the past during a previous UTI.  Flomax should become a chronic med for him

## 2013-06-07 NOTE — Progress Notes (Signed)
Family Medicine Teaching Service Daily Progress Note Intern Pager: [phone]  Patient name: Victor Hutchinson Medical record number: [id number] Date of birth: [birth year] Age: 67 y.o. Gender: male  Primary Care Provider: Unassigned/[facility] Dr. Andrey Campanile Consultants: None Code Status: Full Code  Pt Overview and Major Events to Date:  67 year old male who presented with leukocytosis, weakness, suprapubic pain, dysurai, and urinary urgency for 2 days and UA concerning for UTI. Reportedly with negative prostate exam at [facility].  *7/25 CT abd/pelvis: no obstructing stone.   Assessment and Plan:  # Urinary tract infection WBC stable 17k (febrile o/n x 1). Urine gram stain with GNR. Vss stable. Normal prostate exam in office so doubt prostatitis. CT without appendicitis or obvious diverticulitis.  -much improved per patient (no pain and no weakness) -continue treatment with Ceftriaxone 1g daily -given febrile o/n, WBC 17k, and urine culture not available, plan to observe overnight and continue IV ABX -tylenol for pain, morphine made him feel nauseous [ ]  f/u urine culture  # Nephrolithiasis  -Per CT: No ureterolithiasis. No obstructing stone. Possibly could have passed a stone.  -No flomax or urology consult at present.   # CKD Stage III -continue to monitor BMET. Stable in baseline 1.5-1.7.  [ ]  PVR  # Type II diabetes mellitus A1c 6.8 with diet and glipizide.  -  sensitive SSI. Hold glipizide.  - Carb modified diet   # Gout Continue home probenecid. Could stop if GFR worsened. Uric acid above goal for gout at 7.8 but will not alter home therapy at present.   # HTN controlle.d Continue home verapamill.  passage.   # Hyperlipidemia Could not tolerate lipitor or crestor. No medications at present.   # Hypothyroidism continue levothyroxine.   FEN/GI: 1/2NS @50cc /hr, Carb modified Likely d/c fluids within 24 hours Prophylaxis: Heparin subcutaneous   Disposition: if  continued improvement and afebrile, likely d/c 7/27.   Subjective: Feels much better, no pain, weakness has resolved. Morphine was too strong.   Objective: Temp:  [98 F (36.7 C)-100.4 F (38 C)] 98.4 F (36.9 C) (07/26 0517) Pulse Rate:  [80-91] 86 (07/26 0517) Resp:  [16-18] 18 (07/26 0517) BP: (122-148)/(63-74) 132/71 mmHg (07/26 0517) SpO2:  [95 %-98 %] 96 % (07/26 0517) Weight:  [197 lb 1.5 oz (89.4 kg)-202 lb (91.627 kg)] 197 lb 1.5 oz (89.4 kg) (07/25 1615) Physical Exam: Gen: 67yo male in NAD laying still.  HEENT: [place]/AT Dentures on hard palate  CV: RRR no mrg  Lungs: CTAB  Abd: soft/nontender/nondistended/normal bowel sounds  MSK: moves all extremities, no edema, No CVA tenderness Skin: warm and dry, no rash, + Vitiligo  Neuro: grossly normal, alert and oriented x3.   Laboratory:  Recent Labs Lab 06/06/13 1459 06/06/13 2001 06/07/13 0540  WBC 16.6* 17.0* 17.1*  HGB 13.8* 13.2 12.1*  HCT 43.6 38.5* 36.3*  PLT  --  166 153    Recent Labs Lab 06/06/13 1451 06/06/13 2001 06/07/13 0540  NA 138 137 136  K 4.1 3.9 4.0  CL 104 102 102  CO2 20 24 23   BUN 28* 26* 26*  CREATININE 1.80* 1.62* 1.68*  CALCIUM 9.5 9.6 9.1  PROT 7.5 7.6  --   BILITOT 0.8 0.7  --   ALKPHOS 85 90  --   ALT 11 9  --   AST 15 12  --   GLUCOSE 108* 134* 159*    Urine culture-pending  Imaging/Diagnostic Tests: Ct Abdomen Pelvis Wo Contrast  06/06/2013   *RADIOLOGY REPORT*  Clinical Data: Symptomatic nephrolithiasis  CT ABDOMEN AND PELVIS WITHOUT CONTRAST  Technique:  Multidetector CT imaging of the abdomen and pelvis was performed following the standard protocol without intravenous contrast.  Comparison: CT abdomen pelvis - 10/04/2009  Findings:  The lack of intravenous contrast limits the ability to evaluate solid abdominal organs.  There are bilateral nonobstructing renal stones. Dominant stone within the superior pole of the left kidney measures approximately 1.0 x 0.8 cm (image 47,  series 2) while and additional dominant stone within the inferior pole of the left kidney measures approximate 1.2 x 0.8 cm (image 56, series 2).  There is a punctate (approximately 4 mm) nonobstructing stone within the inferior pole of the right kidney (image 48, series 2). There are additional smaller punctate (approximately 2 mm) stones within the superior pole of the right kidney (coronal image 73) as well as the mid aspect of the right kidney (coronal image 68).  No stones are seen along the expected course of either ureter or within the urinary bladder.  Normal noncontrast appearance of the urinary bladder giving degree of distension.  Grossly symmetric likely age related perinephric stranding.  No urinary obstruction.  ----------------------------------------------------------  Normal hepatic contour.  There is an approximately 1.7 x 1.0 cm stone within the otherwise normal appearing gallbladder.  No definite gallbladder wall thickening or pericholecystic fluid.  No ascites.  Normal noncontrast appearance of the bilateral adrenal glands, pancreas and spleen.  Extensive colonic diverticulosis without evidence of diverticulitis on this noncontrast examination.  The bowel is otherwise normal in course and caliber without wall thickening.  Normal noncontrast appearance of the appendix.  No pneumoperitoneum, pneumatosis or portal venous gas.  Scattered atherosclerotic plaque within a normal caliber abdominal aorta.  No definite bulky retroperitoneal, mesenteric, pelvic or inguinal lymphadenopathy on this noncontrast examination.  Borderline enlarged prostate.  No free fluid in the pelvis.  Limited visualization of the lower thorax demonstrates scattered calcified bilateral punctate granulomas, the sequela of prior granulomas infection.  No focal airspace opacity.  No pleural effusion.  Normal heart size.  No pericardial effusion.  No acute or aggressive osseous abnormalities.  IMPRESSION:  1.  Bilateral  nonobstructing nephrolithiasis, left greater than right.  No evidence of urinary obstruction. 2.  Cholelithiasis without definite evidence of acute cholecystitis on this noncontrast examination.  3.  Colonic diverticulosis without evidence of diverticulitis   Original Report Authenticated By: Tacey Ruiz, MD   Dg Abd 1 View  06/06/2013   *RADIOLOGY REPORT*  Clinical Data: Burning with urination.  ABDOMEN - 1 VIEW  Comparison: None.  Findings: There are two oval calcific densities overlying the left renal shadow.  The larger more superior calcification measures 11 mm in diameter.  No calcifications are seen over the right kidney or expected course of the ureters.  There is a possible small left upper quadrant calcification which could be related to the spleen. The bowel gas pattern is normal.  The osseous structures appear normal.  IMPRESSION: Left abdominal densities suspicious for nephrolithiasis.  Clinically significant discrepancy from primary report, if provided: None   Original Report Authenticated By: Carey Bullocks, M.D.     Shelva Majestic, MD 06/07/2013, 9:29 AM PGY-3, [facility] FPTS Intern pager: [phone], text pages welcome

## 2013-06-07 NOTE — H&P (Signed)
FMTS Attending Admission Note: Victor Eniola,MD I  have seen and examined this patient, reviewed their chart. I have discussed this patient with the resident. I agree with the resident's findings, assessment and care plan.  Briefly: 67 Y/O Male with PMX of HTN,DM II,Gout,Hypothyroidism,CKD was sent to the hospital by [facility] doctor for 2 days hx of burning with urination and mild suprapubic pain,associated with blood in his urine and low grade fever at home,he denies increase urine frequency,no urgency,no retention of urine,but feel like he does not empty bladder completely.He endorsed weakness since yesterday He mentioned he had similar episode in the past that did not warrant hospital admission. At the clinic his urine and blood test done was abnormal with supposedly worsening kidney function.  Exam: Filed Vitals:   06/06/13 1615 06/06/13 2219 06/07/13 0517  BP: 148/74 136/63 132/71  Pulse: 81 91 86  Temp: 99.7 F (37.6 C) 100.4 F (38 C) 98.4 F (36.9 C)  TempSrc: Oral Oral Oral  Resp: 18 18 18   Height: 5\' 10"  (1.778 m)    Weight: 197 lb 1.5 oz (89.4 kg)    SpO2: 98% 95% 96%    Gen: Not in distress, vitals with temp 100.4 HEENT: PERRLA,EOMI. Chest: Air entry equal B/L,no wheezing,no crepitations. Heart: S1S2 normal,no murmurs, RRR. Abdmen: Very mild supra-pubic tenderness,no supra-pubic fullness,ND,BS normal,no organomegaly. CVT neg. Ext: No edema.  A/P: 66 Y/O Male with 1. UTI and Nephrolithiasis;    Abd xray and CT scan reviewed,Bilateral nonobstructing nephrolithiasis, left greater than  right. No evidence of urinary obstruction. Kidney stone size ranges from 8mm x40mm on right and 8mmx11 mm on left.   I agree with IV hydration,Rocephin and pain control.   F/U urine culture report.   Urology consult due to Kidney stone in the setting of infection as well as recurrent episode.   Start Flomax 0.4mg  daily to help with passing if possible.   Strain urine for stone.  2. CKD:  Repeat creatinine level seem to improve from the last done in 2011.     Increase hydration.     Avoid nephrotoxic agent.  3. Gout: On Probenecid.     Consider holding Probenecid since CrCl is slightly less than 5 with creatinine of 1.8.     Morphine prn pain for now.  4. DM/HTN: Continue Home medication.     SSI for DM.      Monitor vitals and serum glucose.     A1C check.  5. Hypothyroidism:Last TSH in 2012 was 12.013.     Currently on Synthroid 25 mcg daily.     To recheck TSH during this admission.     Continue Synthroid 25 mcg for now.

## 2013-06-08 LAB — CBC
Hemoglobin: 11.5 g/dL — ABNORMAL LOW (ref 13.0–17.0)
MCH: 26 pg (ref 26.0–34.0)
MCHC: 32.4 g/dL (ref 30.0–36.0)
MCV: 80.3 fL (ref 78.0–100.0)
RBC: 4.42 MIL/uL (ref 4.22–5.81)

## 2013-06-08 LAB — BASIC METABOLIC PANEL
CO2: 23 mEq/L (ref 19–32)
Calcium: 9.2 mg/dL (ref 8.4–10.5)
Creatinine, Ser: 1.66 mg/dL — ABNORMAL HIGH (ref 0.50–1.35)
Glucose, Bld: 133 mg/dL — ABNORMAL HIGH (ref 70–99)

## 2013-06-08 MED ORDER — CEPHALEXIN 500 MG PO CAPS
500.0000 mg | ORAL_CAPSULE | Freq: Four times a day (QID) | ORAL | Status: DC
  Administered 2013-06-08: 500 mg via ORAL
  Filled 2013-06-08: qty 1

## 2013-06-08 MED ORDER — TAMSULOSIN HCL 0.4 MG PO CAPS: 0.4000 mg | ORAL_CAPSULE | Freq: Every day | ORAL | Status: DC

## 2013-06-08 MED ORDER — CEPHALEXIN 500 MG PO CAPS: 500.0000 mg | ORAL_CAPSULE | Freq: Four times a day (QID) | ORAL | Status: AC

## 2013-06-08 NOTE — Progress Notes (Signed)
Seen and examined.  Feels great.  Discussed with Dr. Mikel Cella.  OK to DC.  Will need to F/U culture results at [facility].  Flomax will be a new chronic med.

## 2013-06-08 NOTE — Progress Notes (Signed)
Pt discharged to home accomp by wife.  Copy of discharge instructions given to pt. And explained.  Rx for flomax and keflex were called into [facility] [address].  Pt understands his med regimen and follow up with [facility].

## 2013-06-08 NOTE — Discharge Summary (Signed)
Family Medicine Teaching [facility] Discharge Summary  Patient name: Victor Hutchinson Medical record number: [id number] Date of birth: [birth year] Age: 67 y.o. Gender: male Date of Admission: 06/06/2013  Date of Discharge: 06/08/2013 Admitting Physician: Janit Pagan, MD  Primary Care Provider: No primary provider on file. Consultants: None  Indication for Hospitalization: complicated UTI  Discharge Diagnoses/Problem List:  Principal Problem:   UTI (urinary tract infection) Active Problems:   Nephrolithiasis   Type II diabetes mellitus   Essential hypertension, benign   Other and unspecified hyperlipidemia   Unspecified hypothyroidism  Disposition: Home  Discharge Condition: Stable  Brief Hospital Course: Patient is 67 yo M with HTN, DM, gout and known renal stones who was admitted directly from [facility] for UTI and renal stones, with concern of early sepsis. He was started on IV fluids, Rocephin, antiemetics and pain medication. CT scan was obtained to rule out obstructing stone which showed multiple stones in both kidneys, but non obstructing. Patient began feeling better with in 24 hours but continued to have dysuria and hesitancy. He was started on Flomax at that time and continued on IV antibiotics for an additional day for a total of 2 doses of Rocephin. By day of discharge, patient's pain had resolved, no nausea and no dysuria. He was tolerating full diet. He was discharged home on Keflex 500mg  QID (Ucx was >100k E.coli), Flomax 0.4mg  and continued on all other home medications. He was stable at discharge and will follow up with [facility] this week for final culture sensitivities.   Issues for Follow Up:  1. Please review E.coli sensitivities 2. Consider keeping patient on Flomax long term. (Only given one month supply)  Significant Procedures: None  Significant Labs and Imaging:   Recent Labs Lab 06/06/13 2001 06/07/13 0540 06/08/13 0645  WBC  17.0* 17.1* 12.9*  HGB 13.2 12.1* 11.5*  HCT 38.5* 36.3* 35.5*  PLT 166 153 139*    Recent Labs Lab 06/06/13 1451 06/06/13 2001 06/07/13 0540 06/08/13 0645  NA 138 137 136 134*  K 4.1 3.9 4.0 3.9  CL 104 102 102 101  CO2 20 24 23 23   GLUCOSE 108* 134* 159* 133*  BUN 28* 26* 26* 26*  CREATININE 1.80* 1.62* 1.68* 1.66*  CALCIUM 9.5 9.6 9.1 9.2  ALKPHOS 85 90  --   --   AST 15 12  --   --   ALT 11 9  --   --   ALBUMIN 4.0 3.4*  --   --     Ct Abdomen Pelvis Wo Contrast  06/06/2013   *RADIOLOGY REPORT*  Clinical Data: Symptomatic nephrolithiasis  CT ABDOMEN AND PELVIS WITHOUT CONTRAST  Technique:  Multidetector CT imaging of the abdomen and pelvis was performed following the standard protocol without intravenous contrast.  Comparison: CT abdomen pelvis - 10/04/2009  Findings:  The lack of intravenous contrast limits the ability to evaluate solid abdominal organs.  There are bilateral nonobstructing renal stones. Dominant stone within the superior pole of the left kidney measures approximately 1.0 x 0.8 cm (image 47, series 2) while and additional dominant stone within the inferior pole of the left kidney measures approximate 1.2 x 0.8 cm (image 56, series 2).  There is a punctate (approximately 4 mm) nonobstructing stone within the inferior pole of the right kidney (image 48, series 2). There are additional smaller punctate (approximately 2 mm) stones within the superior pole of the right kidney (coronal image 73) as well as the mid  aspect of the right kidney (coronal image 68).  No stones are seen along the expected course of either ureter or within the urinary bladder.  Normal noncontrast appearance of the urinary bladder giving degree of distension.  Grossly symmetric likely age related perinephric stranding.  No urinary obstruction.  ----------------------------------------------------------  Normal hepatic contour.  There is an approximately 1.7 x 1.0 cm stone within the otherwise normal  appearing gallbladder.  No definite gallbladder wall thickening or pericholecystic fluid.  No ascites.  Normal noncontrast appearance of the bilateral adrenal glands, pancreas and spleen.  Extensive colonic diverticulosis without evidence of diverticulitis on this noncontrast examination.  The bowel is otherwise normal in course and caliber without wall thickening.  Normal noncontrast appearance of the appendix.  No pneumoperitoneum, pneumatosis or portal venous gas.  Scattered atherosclerotic plaque within a normal caliber abdominal aorta.  No definite bulky retroperitoneal, mesenteric, pelvic or inguinal lymphadenopathy on this noncontrast examination.  Borderline enlarged prostate.  No free fluid in the pelvis.  Limited visualization of the lower thorax demonstrates scattered calcified bilateral punctate granulomas, the sequela of prior granulomas infection.  No focal airspace opacity.  No pleural effusion.  Normal heart size.  No pericardial effusion.  No acute or aggressive osseous abnormalities.  IMPRESSION:  1.  Bilateral nonobstructing nephrolithiasis, left greater than right.  No evidence of urinary obstruction. 2.  Cholelithiasis without definite evidence of acute cholecystitis on this noncontrast examination.  3.  Colonic diverticulosis without evidence of diverticulitis   Original Report Authenticated By: Tacey Ruiz, MD    Outstanding Results: Final urine culture   Discharge Medications:    Medication List         cephALEXin 500 MG capsule  Commonly known as:  KEFLEX  Take 1 capsule (500 mg total) by mouth every 6 (six) hours.     glipiZIDE 5 MG tablet  Commonly known as:  GLUCOTROL  Take 5 mg by mouth every morning.     LEVOTHROID 25 MCG tablet  Generic drug:  levothyroxine  Take 25 mcg by mouth daily before breakfast.     naproxen sodium 220 MG tablet  Commonly known as:  ANAPROX  Take 440 mg by mouth every morning.     probenecid 500 MG tablet  Commonly known as:  BENEMID   Take 250 mg by mouth 2 (two) times daily.     tamsulosin 0.4 MG Caps  Commonly known as:  FLOMAX  Take 1 capsule (0.4 mg total) by mouth daily after supper.     verapamil 240 MG CR tablet  Commonly known as:  CALAN-SR  Take 240 mg by mouth every morning.        Discharge Instructions: Please refer to Patient Instructions section of EMR for full details.  Patient was counseled important signs and symptoms that should prompt return to medical care, changes in medications, dietary instructions, activity restrictions, and follow up appointments.   Follow-Up Appointments:     Follow-up Information   Call [facility]. (Please call to be seen next week for a follow up)    Contact information:   [address] [place] [address]       Hilarie Fredrickson, MD 06/08/2013, 4:54 PM PGY-3, [facility]

## 2013-06-08 NOTE — Progress Notes (Signed)
Family Medicine Teaching Service Daily Progress Note Intern Pager: [phone]  Patient name: Victor Hutchinson Medical record number: [id number] Date of birth: 12-Jun-1946 Age: 67 y.o. Gender: male  Primary Care Provider: Unassigned/[facility] Dr. Andrey Campanile Consultants: None Code Status: Full Code  Pt Overview and Major Events to Date:  67 year old male who presented with leukocytosis, weakness, suprapubic pain, dysurai, and urinary urgency for 2 days and UA concerning for UTI. Reportedly with negative prostate exam at [facility].  *7/25 CT abd/pelvis: no obstructing stone.   Assessment and Plan:  # Urinary tract infection WBC improving. Urine gram stain with GNR. Vss stable. Normal prostate exam in office so doubt prostatitis. CT without appendicitis or obvious diverticulitis, but shows multiple non-obstructing stones.  -symptoms resolved, per patient -s/p CTX x2 doses [ ]  f/u urine culture - Still in process, but given response to CTX and GNR on gram stain, will switch to Keflex PO today   # Nephrolithiasis  -Per CT: No ureterolithiasis. No obstructing stone. Possibly could have passed a stone.  - Continue Flomax as an outpatient   # CKD Stage III. Stable at baseline 1.5-1.7.  - PVR wnl  # Type II diabetes mellitus A1c 6.8 with diet and glipizide.  - sensitive SSI. Hold glipizide.  - Carb modified diet   # Gout Continue home probenecid. Uric acid above goal for gout at 7.8 but will not alter home therapy. Can f/u as outpatient  # HTN controlled  - Continue home verapamill.  # Hyperlipidemia Could not tolerate lipitor or crestor. No medications at present.   # Hypothyroidism continue levothyroxine.   FEN/GI: 1/2NS @50cc /hr, Carb modified Likely d/c fluids within 24 hours Prophylaxis: Heparin subcutaneous   Disposition: Much improved. No pain, nausea or difficulty urinating. Switch to PO antibiotics today and d/c home in stable condition.  Subjective: States  he is "back to a normal person." Denies any symptoms at present.  Objective: Temp:  [98.1 F (36.7 C)-99.7 F (37.6 C)] 98.1 F (36.7 C) (07/27 0507) Pulse Rate:  [73-84] 84 (07/27 0507) Resp:  [18] 18 (07/27 0507) BP: (118-132)/(64-70) 132/68 mmHg (07/27 0507) SpO2:  [95 %-96 %] 96 % (07/27 0507) Physical Exam: Gen: 67yo male in NAD, sitting up in chair eating breakfast. Happy and laughing. HEENT: [place]/AT Dentures on hard palate. MMM CV: RRR no mrg  Lungs: CTAB  Abd: soft/nontender/nondistended/normal bowel sounds. MSK: moves all extremities, No CVA tenderness Skin: warm and dry, no rash, + Vitiligo  Neuro: grossly normal without focal deficit  Laboratory:  Recent Labs Lab 06/06/13 2001 06/07/13 0540 06/08/13 0645  WBC 17.0* 17.1* 12.9*  HGB 13.2 12.1* 11.5*  HCT 38.5* 36.3* 35.5*  PLT 166 153 139*    Recent Labs Lab 06/06/13 1451 06/06/13 2001 06/07/13 0540 06/08/13 0645  NA 138 137 136 134*  K 4.1 3.9 4.0 3.9  CL 104 102 102 101  CO2 20 24 23 23   BUN 28* 26* 26* 26*  CREATININE 1.80* 1.62* 1.68* 1.66*  CALCIUM 9.5 9.6 9.1 9.2  PROT 7.5 7.6  --   --   BILITOT 0.8 0.7  --   --   ALKPHOS 85 90  --   --   ALT 11 9  --   --   AST 15 12  --   --   GLUCOSE 108* 134* 159* 133*   Urine culture-pending  Imaging/Diagnostic Tests: Ct Abdomen Pelvis Wo Contrast  06/06/2013   *RADIOLOGY REPORT*  Clinical Data: Symptomatic  nephrolithiasis  CT ABDOMEN AND PELVIS WITHOUT CONTRAST  Technique:  Multidetector CT imaging of the abdomen and pelvis was performed following the standard protocol without intravenous contrast.  Comparison: CT abdomen pelvis - 10/04/2009  Findings:  The lack of intravenous contrast limits the ability to evaluate solid abdominal organs.  There are bilateral nonobstructing renal stones. Dominant stone within the superior pole of the left kidney measures approximately 1.0 x 0.8 cm (image 47, series 2) while and additional dominant stone within the  inferior pole of the left kidney measures approximate 1.2 x 0.8 cm (image 56, series 2).  There is a punctate (approximately 4 mm) nonobstructing stone within the inferior pole of the right kidney (image 48, series 2). There are additional smaller punctate (approximately 2 mm) stones within the superior pole of the right kidney (coronal image 73) as well as the mid aspect of the right kidney (coronal image 68).  No stones are seen along the expected course of either ureter or within the urinary bladder.  Normal noncontrast appearance of the urinary bladder giving degree of distension.  Grossly symmetric likely age related perinephric stranding.  No urinary obstruction.  ----------------------------------------------------------  Normal hepatic contour.  There is an approximately 1.7 x 1.0 cm stone within the otherwise normal appearing gallbladder.  No definite gallbladder wall thickening or pericholecystic fluid.  No ascites.  Normal noncontrast appearance of the bilateral adrenal glands, pancreas and spleen.  Extensive colonic diverticulosis without evidence of diverticulitis on this noncontrast examination.  The bowel is otherwise normal in course and caliber without wall thickening.  Normal noncontrast appearance of the appendix.  No pneumoperitoneum, pneumatosis or portal venous gas.  Scattered atherosclerotic plaque within a normal caliber abdominal aorta.  No definite bulky retroperitoneal, mesenteric, pelvic or inguinal lymphadenopathy on this noncontrast examination.  Borderline enlarged prostate.  No free fluid in the pelvis.  Limited visualization of the lower thorax demonstrates scattered calcified bilateral punctate granulomas, the sequela of prior granulomas infection.  No focal airspace opacity.  No pleural effusion.  Normal heart size.  No pericardial effusion.  No acute or aggressive osseous abnormalities.  IMPRESSION:  1.  Bilateral nonobstructing nephrolithiasis, left greater than right.  No  evidence of urinary obstruction. 2.  Cholelithiasis without definite evidence of acute cholecystitis on this noncontrast examination.  3.  Colonic diverticulosis without evidence of diverticulitis   Original Report Authenticated By: Tacey Ruiz, MD   Dg Abd 1 View  06/06/2013   *RADIOLOGY REPORT*  Clinical Data: Burning with urination.  ABDOMEN - 1 VIEW  Comparison: None.  Findings: There are two oval calcific densities overlying the left renal shadow.  The larger more superior calcification measures 11 mm in diameter.  No calcifications are seen over the right kidney or expected course of the ureters.  There is a possible small left upper quadrant calcification which could be related to the spleen. The bowel gas pattern is normal.  The osseous structures appear normal.  IMPRESSION: Left abdominal densities suspicious for nephrolithiasis.  Clinically significant discrepancy from primary report, if provided: None   Original Report Authenticated By: Carey Bullocks, M.D.    Hilarie Fredrickson, MD 06/08/2013, 8:37 AM PGY-3, [facility] FPTS Intern pager: [phone], text pages welcome

## 2013-06-09 LAB — URINE CULTURE

## 2013-06-09 LAB — GLUCOSE, CAPILLARY: Glucose-Capillary: 142 mg/dL — ABNORMAL HIGH (ref 70–99)

## 2013-06-09 NOTE — Discharge Summary (Signed)
Seen and examined on the day of DC.  Agree with DC as outlined by DR. Hairford.

## 2013-06-13 ENCOUNTER — Ambulatory Visit (INDEPENDENT_AMBULATORY_CARE_PROVIDER_SITE_OTHER): Payer: PRIVATE HEALTH INSURANCE | Admitting: Emergency Medicine

## 2013-06-13 VITALS — BP 128/84 | HR 102 | Temp 98.6°F | Resp 18 | Ht 70.0 in | Wt 200.2 lb

## 2013-06-13 DIAGNOSIS — N289 Disorder of kidney and ureter, unspecified: Secondary | ICD-10-CM

## 2013-06-13 DIAGNOSIS — N201 Calculus of ureter: Secondary | ICD-10-CM

## 2013-06-13 DIAGNOSIS — M1A9XX1 Chronic gout, unspecified, with tophus (tophi): Secondary | ICD-10-CM

## 2013-06-13 DIAGNOSIS — N200 Calculus of kidney: Secondary | ICD-10-CM

## 2013-06-13 DIAGNOSIS — E079 Disorder of thyroid, unspecified: Secondary | ICD-10-CM

## 2013-06-13 DIAGNOSIS — N390 Urinary tract infection, site not specified: Secondary | ICD-10-CM

## 2013-06-13 DIAGNOSIS — M1A00X1 Idiopathic chronic gout, unspecified site, with tophus (tophi): Secondary | ICD-10-CM

## 2013-06-13 LAB — POCT CBC
Granulocyte percent: 77.1 % (ref 37–80)
HCT, POC: 45.7 % (ref 43.5–53.7)
Hemoglobin: 14.6 g/dL (ref 14.1–18.1)
Lymph, poc: 1.1 (ref 0.6–3.4)
MCH, POC: 26.8 pg — AB (ref 27–31.2)
MCHC: 31.9 g/dL (ref 31.8–35.4)
MCV: 83.8 fL (ref 80–97)
MID (cbc): 0.5 (ref 0–0.9)
MPV: 7.4 fL (ref 0–99.8)
POC Granulocyte: 5.6 (ref 2–6.9)
POC LYMPH PERCENT: 15.3 % (ref 10–50)
POC MID %: 7.6 % (ref 0–12)
Platelet Count, POC: 260 10*3/uL (ref 142–424)
RBC: 5.45 M/uL (ref 4.69–6.13)
RDW, POC: 15.6 %
WBC: 7.2 10*3/uL (ref 4.6–10.2)

## 2013-06-13 LAB — POCT URINALYSIS DIPSTICK
Bilirubin, UA: NEGATIVE
Blood, UA: NEGATIVE
Glucose, UA: NEGATIVE
Ketones, UA: NEGATIVE
Leukocytes, UA: NEGATIVE
Nitrite, UA: NEGATIVE
Protein, UA: NEGATIVE
Spec Grav, UA: 1.02
Urobilinogen, UA: 0.2
pH, UA: 5.5

## 2013-06-13 LAB — POCT UA - MICROSCOPIC ONLY
Epithelial cells, urine per micros: NEGATIVE
Mucus, UA: NEGATIVE
Yeast, UA: NEGATIVE

## 2013-06-13 MED ORDER — LEVOTHYROXINE SODIUM 25 MCG PO TABS: 25.0000 ug | ORAL_TABLET | Freq: Every day | ORAL | Status: DC

## 2013-06-13 MED ORDER — TAMSULOSIN HCL 0.4 MG PO CAPS: 0.4000 mg | ORAL_CAPSULE | Freq: Every day | ORAL | Status: DC

## 2013-06-13 NOTE — Patient Instructions (Addendum)
Make your appt for your physical at [address] Come in sooner if worsening  You will get a phone call from Dr Dierdre Forth for the appt with his office.

## 2013-06-13 NOTE — Progress Notes (Signed)
  Subjective:    Patient ID: Victor Hutchinson, male    DOB: 08-02-1946, 67 y.o.   MRN: [id number]  HPI patient here to followup recent hospitalization for urinary tract infection and kidney stones. He is also known to have renal dysfunction. He was admitted for 24 hours and discharged on oral medications. He has been on Keflex since discharge. Patient reports he is feeling much better. He has good urinary output. States previously he was leaking urine, (describes as dribbling) but this has also resolved. Patient states his appetite is better, and he is feeling 100 % now. He agrees to schedule an appointment next door for a physical.    Review of Systems     Objective:   Physical Exam patient is alert and cooperative he is in no distress. His neck is supple. Chest was clear. Abdomen is soft nontender. He has tophi present over both elbows .  Patient in no distress moves easily from chair to exam table during exam. Abdomen soft and non tender. Feels like he is ready to return to work  Results for orders placed in visit on 06/13/13  POCT CBC      Result Value Range   WBC 7.2  4.6 - 10.2 K/uL   Lymph, poc 1.1  0.6 - 3.4   POC LYMPH PERCENT 15.3  10 - 50 %L   MID (cbc) 0.5  0 - 0.9   POC MID % 7.6  0 - 12 %M   POC Granulocyte 5.6  2 - 6.9   Granulocyte percent 77.1  37 - 80 %G   RBC 5.45  4.69 - 6.13 M/uL   Hemoglobin 14.6  14.1 - 18.1 g/dL   HCT, POC 13.0  86.5 - 53.7 %   MCV 83.8  80 - 97 fL   MCH, POC 26.8 (*) 27 - 31.2 pg   MCHC 31.9  31.8 - 35.4 g/dL   RDW, POC 78.4     Platelet Count, POC 260  142 - 424 K/uL   MPV 7.4  0 - 99.8 fL  POCT URINALYSIS DIPSTICK      Result Value Range   Color, UA yellow     Clarity, UA clear     Glucose, UA neg     Bilirubin, UA neg     Ketones, UA neg     Spec Grav, UA 1.020     Blood, UA neg     pH, UA 5.5     Protein, UA neg     Urobilinogen, UA 0.2     Nitrite, UA neg     Leukocytes, UA Negative    POCT UA - MICROSCOPIC ONLY      Result  Value Range   WBC, Ur, HPF, POC 0-1     RBC, urine, microscopic neg     Bacteria, U Microscopic neg     Mucus, UA neg     Epithelial cells, urine per micros neg     Crystals, Ur, HPF, POC neg     Casts, Ur, LPF, POC neg     Yeast, UA neg     Amorphous pos        Assessment & Plan:  Patient doing well. Rheumatology referral made. Patient encouraged to make a appointment for physical. Urine is clear. His Flomax was refilled and he was given a one-month prescription for Synthroid .

## 2013-06-14 ENCOUNTER — Other Ambulatory Visit: Payer: Self-pay | Admitting: Emergency Medicine

## 2013-06-14 DIAGNOSIS — R972 Elevated prostate specific antigen [PSA]: Secondary | ICD-10-CM

## 2013-06-14 LAB — TSH: TSH: 7.462 u[IU]/mL — ABNORMAL HIGH (ref 0.350–4.500)

## 2013-06-14 LAB — BASIC METABOLIC PANEL
BUN: 29 mg/dL — ABNORMAL HIGH (ref 6–23)
Chloride: 102 mEq/L (ref 96–112)
Creat: 1.85 mg/dL — ABNORMAL HIGH (ref 0.50–1.35)

## 2013-06-18 ENCOUNTER — Other Ambulatory Visit: Payer: Self-pay

## 2013-06-28 ENCOUNTER — Ambulatory Visit: Payer: PRIVATE HEALTH INSURANCE

## 2013-06-28 ENCOUNTER — Ambulatory Visit (INDEPENDENT_AMBULATORY_CARE_PROVIDER_SITE_OTHER): Payer: PRIVATE HEALTH INSURANCE | Admitting: Family Medicine

## 2013-06-28 ENCOUNTER — Encounter: Payer: Self-pay | Admitting: Family Medicine

## 2013-06-28 VITALS — BP 140/84 | HR 93 | Temp 98.1°F | Resp 16 | Ht 68.0 in | Wt 196.0 lb

## 2013-06-28 DIAGNOSIS — M109 Gout, unspecified: Secondary | ICD-10-CM

## 2013-06-28 DIAGNOSIS — R972 Elevated prostate specific antigen [PSA]: Secondary | ICD-10-CM

## 2013-06-28 DIAGNOSIS — M25579 Pain in unspecified ankle and joints of unspecified foot: Secondary | ICD-10-CM

## 2013-06-28 DIAGNOSIS — M25572 Pain in left ankle and joints of left foot: Secondary | ICD-10-CM

## 2013-06-28 DIAGNOSIS — N289 Disorder of kidney and ureter, unspecified: Secondary | ICD-10-CM

## 2013-06-28 DIAGNOSIS — E119 Type 2 diabetes mellitus without complications: Secondary | ICD-10-CM

## 2013-06-28 LAB — BASIC METABOLIC PANEL
Chloride: 103 mEq/L (ref 96–112)
Potassium: 4.3 mEq/L (ref 3.5–5.3)
Sodium: 137 mEq/L (ref 135–145)

## 2013-06-28 LAB — POCT CBC
Granulocyte percent: 77.2 %G (ref 37–80)
MCV: 84.6 fL (ref 80–97)
MID (cbc): 0.6 (ref 0–0.9)
MPV: 6.9 fL (ref 0–99.8)
POC MID %: 7.6 %M (ref 0–12)
Platelet Count, POC: 171 10*3/uL (ref 142–424)
RBC: 5.07 M/uL (ref 4.69–6.13)

## 2013-06-28 LAB — GLUCOSE, POCT (MANUAL RESULT ENTRY): POC Glucose: 131 mg/dl — AB (ref 70–99)

## 2013-06-28 MED ORDER — HYDROCODONE-ACETAMINOPHEN 5-325 MG PO TABS: 1.0000 | ORAL_TABLET | Freq: Four times a day (QID) | ORAL | Status: DC | PRN

## 2013-06-28 MED ORDER — PREDNISONE 20 MG PO TABS: 40.0000 mg | ORAL_TABLET | Freq: Every day | ORAL | Status: DC

## 2013-06-28 NOTE — Patient Instructions (Signed)
You should receive a call or letter about your lab results within the next week to 10 days.  If any recurrence of urinary symptoms, abdominal pain, or fever- return for recheck.  Can take hydrocodone only if needed for pain.  If your ankle pain continues to improve, do not need to start prednisone, but if needed tomorrow - 2 pills for 5 days only and monitor your blood sugars more frequently while taking this. Call if greater than 250 to discuss plan.  Keep follow up with specialists as planned.

## 2013-06-28 NOTE — Progress Notes (Signed)
Subjective:    Patient ID: Victor Hutchinson, male    DOB: September 23, 1946, 67 y.o.   MRN: [id number]  HPI Victor Hutchinson is a 67 y.o. male Recent hospitalization for UTI and kidney stones. Hx of renal dysfunction. He was admitted for 24 hours and discharged on oral medications - Keflex.  Improving at follow up with Dr. Cleta Alberts 06/13/13.   Creat 1.85 on 06/13/13 ov (up from 1.80 on 06/06/13).  Noted to have elevated PSA as below, referred to urology. Urine culture i8/1/14 - no growth. (pansensitive E Coli in hospital culture). Urology appt sept 25th. No recent urinary urgency, frequency or dysuria. No hematuria.   Hx of Gout - referred to Rheumatology. appt pending Dr. Dierdre Forth - Sept. 5th. Current pain in L ankle and big toe. Hx of tophi.  Started with pain about 2 weeks ago- progressively more sore. Usually can take colchicine for relief.  No initial treatment, but tried 4 colcrys yesterday - no relief.  Has not taken prednisone in past.  Usually takes probenecid 500mg  BID, but stopped 3 days ago as didn't feel like it was working. Chills 2 nights ago, but no measured fever. No urinary sx's as above, no cough, no other infection sx's. No wounds to foot. Worse pain yesterday - let up today - 50%better with pain in foot today.    Hx of DM2 - A1c 6.8 in hospital 05/2013. Home cbg's 90- 120's. Takes glipizide 5mg  qd.    Review of Systems  Constitutional: Positive for chills (last night. ).  Genitourinary: Negative for dysuria, urgency, frequency, hematuria and difficulty urinating.  Musculoskeletal: Positive for joint swelling and arthralgias.  Skin: Negative for rash.       Objective:   Physical Exam  Vitals reviewed. Constitutional: He is oriented to person, place, and time. He appears well-developed and well-nourished. No distress.  Crutches used for ambulation.   Cardiovascular: Normal rate and regular rhythm.   Pulmonary/Chest: Effort normal.  Abdominal: Soft. There is no tenderness.    Musculoskeletal:       Feet:  Neurological: He is alert and oriented to person, place, and time.  Skin: Skin is warm and dry. There is erythema (very faint erythema medial L ankle, and over 1st MTP tophi medially.  no distal or proximal erythema. no wound or rashes. ).  Psychiatric: He has a normal mood and affect. His behavior is normal.     [facility] reading (PRIMARY) by  Dr. Neva Seat: L ankle - degenerative changes noted, including medial ankle.  Results for orders placed in visit on 06/28/13  POCT CBC      Result Value Range   WBC 7.3  4.6 - 10.2 K/uL   Lymph, poc 1.1  0.6 - 3.4   POC LYMPH PERCENT 15.2  10 - 50 %L   MID (cbc) 0.6  0 - 0.9   POC MID % 7.6  0 - 12 %M   POC Granulocyte 5.6  2 - 6.9   Granulocyte percent 77.2  37 - 80 %G   RBC 5.07  4.69 - 6.13 M/uL   Hemoglobin 13.6 (*) 14.1 - 18.1 g/dL   HCT, POC 78.2 (*) 95.6 - 53.7 %   MCV 84.6  80 - 97 fL   MCH, POC 26.8 (*) 27 - 31.2 pg   MCHC 31.7 (*) 31.8 - 35.4 g/dL   RDW, POC 21.3     Platelet Count, POC 171  142 - 424 K/uL   MPV 6.9  0 -  99.8 fL  GLUCOSE, POCT (MANUAL RESULT ENTRY)      Result Value Range   POC Glucose 131 (*) 70 - 99 mg/dl      Assessment & Plan:  Victor Hutchinson is a 67 y.o. male Elevated PSA - Plan: PSA recheck. Possible prostatitis prior, but asx now.   Renal insufficiency - Plan: POCT CBC, Basic metabolic panel.  Recheck BMP, cautioned on NSAID use.   DM2 (diabetes mellitus, type 2) - Plan: POCT glucose (manual entry) - controlled on A1C in hospital. If starting prednisone for gout - advised of possible elevation in readings and plan.   Pain in joint, ankle and foot, left -Gout attack - Plan: POCT CBC, Uric Acid, DG Ankle Complete Left, POCT glucose (manual entry), predniSONE (DELTASONE) 20 MG tablet, HYDROcodone-acetaminophen (NORCO/VICODIN) 5-325 MG per tablet - ~2week hx of L ankle and MTP gout flare, but some improvement today. Can treat sx's with lortab if needed, SED/cautioned, try to  avoid nsaids with hx renal dz.  If not continuing to imporove tomorrow - can start prednisone 40mg  qd for 5 days - sed, but would initially try to avoid due to underlying DM2. rtc precautions, rheumatology appt pending, check uric acid.    Meds ordered this encounter  Medications  . predniSONE (DELTASONE) 20 MG tablet    Sig: Take 2 tablets (40 mg total) by mouth daily.    Dispense:  10 tablet    Refill:  0  . HYDROcodone-acetaminophen (NORCO/VICODIN) 5-325 MG per tablet    Sig: Take 1 tablet by mouth every 6 (six) hours as needed for pain.    Dispense:  20 tablet    Refill:  0   Patient Instructions  You should receive a call or letter about your lab results within the next week to 10 days.  If any recurrence of urinary symptoms, abdominal pain, or fever- return for recheck.  Can take hydrocodone only if needed for pain.  If your ankle pain continues to improve, do not need to start prednisone, but if needed tomorrow - 2 pills for 5 days only and monitor your blood sugars more frequently while taking this. Call if greater than 250 to discuss plan.  Keep follow up with specialists as planned.

## 2013-06-29 LAB — PSA: PSA: 2.6 ng/mL (ref <=4.00)

## 2013-07-02 ENCOUNTER — Telehealth: Payer: Self-pay

## 2013-07-02 MED ORDER — TAMSULOSIN HCL 0.4 MG PO CAPS: 0.4000 mg | ORAL_CAPSULE | Freq: Every day | ORAL | Status: DC

## 2013-07-02 NOTE — Telephone Encounter (Signed)
Elnita Maxwell stated that they did not ever get the Rx from 06/13/13. She will call [employer] and call us back if we need to re-send it.

## 2013-07-02 NOTE — Telephone Encounter (Signed)
Wife CB and asked that we call Rx in. That is what [employer] asked [place] to do. I called in Rx and verified that we do have the correct electronic info for [facility].

## 2013-07-02 NOTE — Telephone Encounter (Signed)
Patient just received #[phone] on 06/13/13. Why does he need a refill already?

## 2013-07-02 NOTE — Telephone Encounter (Signed)
Patient needs a refill on Flomax. 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 Please call wife Elnita Maxwell at [phone]

## 2013-07-02 NOTE — Telephone Encounter (Signed)
Lm for rtn call 

## 2013-07-02 NOTE — Addendum Note (Signed)
Addended by: Sheppard Plumber A on: 07/02/2013 04:59 PM   Modules accepted: Orders

## 2013-07-05 ENCOUNTER — Other Ambulatory Visit (HOSPITAL_COMMUNITY): Payer: Self-pay | Admitting: Family Medicine

## 2013-07-06 ENCOUNTER — Ambulatory Visit (INDEPENDENT_AMBULATORY_CARE_PROVIDER_SITE_OTHER): Payer: PRIVATE HEALTH INSURANCE | Admitting: Family Medicine

## 2013-07-06 ENCOUNTER — Encounter: Payer: Self-pay | Admitting: Family Medicine

## 2013-07-06 ENCOUNTER — Telehealth: Payer: Self-pay

## 2013-07-06 DIAGNOSIS — M79609 Pain in unspecified limb: Secondary | ICD-10-CM

## 2013-07-06 DIAGNOSIS — M25572 Pain in left ankle and joints of left foot: Secondary | ICD-10-CM

## 2013-07-06 DIAGNOSIS — M25522 Pain in left elbow: Secondary | ICD-10-CM

## 2013-07-06 DIAGNOSIS — M25579 Pain in unspecified ankle and joints of unspecified foot: Secondary | ICD-10-CM

## 2013-07-06 DIAGNOSIS — M109 Gout, unspecified: Secondary | ICD-10-CM

## 2013-07-06 DIAGNOSIS — K769 Liver disease, unspecified: Secondary | ICD-10-CM

## 2013-07-06 DIAGNOSIS — N289 Disorder of kidney and ureter, unspecified: Secondary | ICD-10-CM

## 2013-07-06 DIAGNOSIS — M25529 Pain in unspecified elbow: Secondary | ICD-10-CM

## 2013-07-06 DIAGNOSIS — E119 Type 2 diabetes mellitus without complications: Secondary | ICD-10-CM

## 2013-07-06 LAB — POCT CBC
HCT, POC: 47.3 % (ref 43.5–53.7)
Lymph, poc: 1 (ref 0.6–3.4)
MCHC: 31.7 g/dL — AB (ref 31.8–35.4)
MCV: 85.5 fL (ref 80–97)
POC LYMPH PERCENT: 13.5 %L (ref 10–50)
RDW, POC: 16.9 %
WBC: 7.4 10*3/uL (ref 4.6–10.2)

## 2013-07-06 LAB — COMPREHENSIVE METABOLIC PANEL
Albumin: 4.3 g/dL (ref 3.5–5.2)
Alkaline Phosphatase: 92 U/L (ref 39–117)
BUN: 29 mg/dL — ABNORMAL HIGH (ref 6–23)
CO2: 25 mEq/L (ref 19–32)
Glucose, Bld: 114 mg/dL — ABNORMAL HIGH (ref 70–99)
Potassium: 4.4 mEq/L (ref 3.5–5.3)
Total Bilirubin: 0.4 mg/dL (ref 0.3–1.2)

## 2013-07-06 MED ORDER — PREDNISONE 20 MG PO TABS: ORAL_TABLET | ORAL | Status: DC

## 2013-07-06 MED ORDER — HYDROCODONE-ACETAMINOPHEN 5-325 MG PO TABS: 1.0000 | ORAL_TABLET | Freq: Four times a day (QID) | ORAL | Status: DC | PRN

## 2013-07-06 NOTE — Telephone Encounter (Signed)
Pt is calling about lab results [facility] number [phone]

## 2013-07-06 NOTE — Progress Notes (Signed)
[address]   [place], [place]  [address]   [id number]  Subjective:    Patient ID: Victor Hutchinson, male    DOB: January 22, 1946, 67 y.o.   MRN: [id number]  HPI This 67 y.o. male presents for evaluation of recurrent gout.  Evaluated eight days ago by Dr. Neva Seat; prescribed Prednisone 20mg  two daily x 5 days; pain improved within 6 hours of first prednisone dose.  Really felt great on prednisone.  Symptoms recurred within 48 hours of stopping Prednisone.  Pain recurred in L ankle and L first MTP; also having some L elbow pain.  Now also L foot proximal is red and swollen.  Onset of gout twenty years.  Tophi all over body; elbows, toes, knuckle.  Uric acid level eight days ago 7.5.  Uric acid level has been as high as 11.0 in the past. No previous allopurinol use.  Has tophi on elbows, feet, fingers. Taking Probenacid without improvement or benefit.  Frequency of gouty attacks just as frequent. Colchicine previously worked well; Colcrys not affective for patient. Blood sugars ran 100-130 on prednisone.  Checked four times daily while taking Prednisone. Dr. Alwyn Ren tried patient on Uloric in past but gouty symptoms worsened.   Has renal insufficiency; no previous nephrology consultation; last Cr of 2.05 two weeks ago. Has appointment with rheumatology on 07/21/13 per the recommendations of Dr. Cleta Alberts.   PCP:  [facility].   Review of Systems  Constitutional: Negative for fever, chills, diaphoresis and fatigue.  Endocrine: Negative for polydipsia, polyphagia and polyuria.  Musculoskeletal: Positive for joint swelling, arthralgias and gait problem. Negative for myalgias.  Skin: Positive for color change.   Past Medical History  Diagnosis Date  . Diabetes mellitus without complication   . Hypertension   . Hypothyroidism   . Gout 06/06/2013  . Nephrolithiasis 06/06/2013  . Other and unspecified hyperlipidemia 06/06/2013   History reviewed. No pertinent past surgical history. No Known  Allergies Current Outpatient Prescriptions on File Prior to Visit  Medication Sig Dispense Refill  . glipiZIDE (GLUCOTROL) 5 MG tablet Take 5 mg by mouth every morning.       Marland Kitchen levothyroxine (LEVOTHROID) 25 MCG tablet Take 1 tablet (25 mcg total) by mouth daily before breakfast.  30 tablet  0  . naproxen sodium (ANAPROX) 220 MG tablet Take 440 mg by mouth every morning.       . probenecid (BENEMID) 500 MG tablet Take 250 mg by mouth 2 (two) times daily.       . verapamil (CALAN-SR) 240 MG CR tablet Take 240 mg by mouth every morning.       . tamsulosin (FLOMAX) 0.4 MG CAPS capsule Take 1 capsule (0.4 mg total) by mouth daily after supper.  90 capsule  1   No current facility-administered medications on file prior to visit.   History   Social History  . Marital Status: Married    Spouse Name: N/A    Number of Children: N/A  . Years of Education: N/A   Occupational History  . Not on file.   Social History Main Topics  . Smoking status: Never Smoker   . Smokeless tobacco: Not on file  . Alcohol Use: No  . Drug Use: No  . Sexual Activity: Yes   Other Topics Concern  . Not on file   Social History Narrative  . No narrative on file       Objective:   Physical Exam  Nursing note and vitals reviewed. Constitutional:  He appears well-developed and well-nourished. No distress.  HENT:  Head: Normocephalic and atraumatic.  Musculoskeletal:       Left ankle: He exhibits decreased range of motion. He exhibits no swelling, no ecchymosis, no deformity, no laceration and normal pulse. Tenderness. Medial malleolus tenderness found. Achilles tendon normal.       Left foot: He exhibits decreased range of motion, tenderness and swelling. He exhibits no bony tenderness.       Feet:  L ANKLE; DECREASED ROM; +TTP MEDIAL ASPECT OF ANKLE; MILD WARMTH.   L FOOT:  ERYTHEMA AND WARMTH ALONG DORSAL ASPECT OF PROXIMAL FOOT.  +SWELLING; MINIMAL TTP.  L FIRST MTP WITH LARGE TOPHI. L ELBOW; MILD  WARMTH; LARGE TOPHI LIKE PROTRUSION FORM OLECRANON.  Skin: Skin is warm and dry. He is not diaphoretic. There is erythema.  Psychiatric: He has a normal mood and affect. His behavior is normal. Thought content normal.       Assessment & Plan:  Pain, foot, left  Left ankle pain  Elbow pain, left  Pain in joint, ankle and foot, left - Plan: predniSONE (DELTASONE) 20 MG tablet, HYDROcodone-acetaminophen (NORCO/VICODIN) 5-325 MG per tablet  Gout attack - Plan: predniSONE (DELTASONE) 20 MG tablet, HYDROcodone-acetaminophen (NORCO/VICODIN) 5-325 MG per tablet  Renal insufficiency - Plan: POCT CBC, Comprehensive metabolic panel   1.  Pain foot/ankle/elbow L:  New.  Secondary to gouty attack. Rx for Hydrocodone provided; no driving or operating machinery. 2.  Gouty attack multiple joints;  Recurrent; rx for Prednisone provided.  Recent Uric Acid of 7.5.  Appointment with rheumatology in two weeks.   3.  Renal insufficiency: worsening; repeat labs today; recommend nephrology appointment if Cr remains > 1.85. 4. DM: stable; monitor sugars closely with Prednisone taper.  Meds ordered this encounter  Medications  . predniSONE (DELTASONE) 20 MG tablet    Sig: Two tablets daily x 5 days then one tablet daily x 5 days    Dispense:  15 tablet    Refill:  0  . HYDROcodone-acetaminophen (NORCO/VICODIN) 5-325 MG per tablet    Sig: Take 1 tablet by mouth every 6 (six) hours as needed for pain.    Dispense:  30 tablet    Refill:  0

## 2013-07-06 NOTE — Telephone Encounter (Signed)
Please advise 

## 2013-07-07 NOTE — Telephone Encounter (Signed)
Patient seen in follow up with Dr. Katrinka Blazing yesterday, labs noted then, and rechecked.

## 2013-07-10 ENCOUNTER — Encounter: Payer: Self-pay | Admitting: Family Medicine

## 2013-07-12 ENCOUNTER — Other Ambulatory Visit: Payer: Self-pay | Admitting: *Deleted

## 2013-07-12 MED ORDER — TAMSULOSIN HCL 0.4 MG PO CAPS: 0.4000 mg | ORAL_CAPSULE | Freq: Every day | ORAL | Status: DC

## 2013-07-13 ENCOUNTER — Other Ambulatory Visit: Payer: Self-pay | Admitting: Emergency Medicine

## 2013-07-21 ENCOUNTER — Telehealth: Payer: Self-pay | Admitting: Radiology

## 2013-07-21 NOTE — Telephone Encounter (Signed)
Received notification patient did not get our mychart message, letter sent.

## 2013-09-18 ENCOUNTER — Other Ambulatory Visit: Payer: Self-pay

## 2013-10-04 ENCOUNTER — Other Ambulatory Visit: Payer: Self-pay | Admitting: Physician Assistant

## 2013-12-25 ENCOUNTER — Other Ambulatory Visit: Payer: Self-pay | Admitting: Emergency Medicine

## 2015-04-04 IMAGING — CT CT ABD-PELV W/O CM
2 of 4 series · 16 of 46 positions shown, 18 images · non-contrast
Comparison: CT abdomen pelvis - 10/04/2009

CLINICAL DATA: Symptomatic nephrolithiasis

CT ABDOMEN AND PELVIS WITHOUT CONTRAST
TECHNIQUE: Multidetector CT imaging of the abdomen and pelvis was
performed following the standard protocol without intravenous
contrast.

[Series 2: routine · axial · 0.86mm/px · z∈[+498,+1023]mm · 13 of 115 slices shown, 15 images]
[im 5/115  soft-tissue]
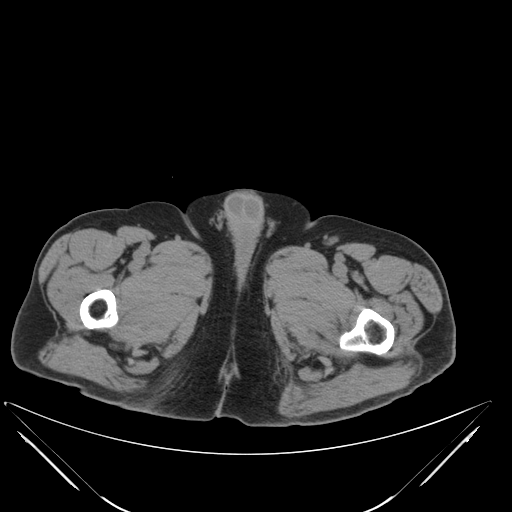
[im 5/115  bone]
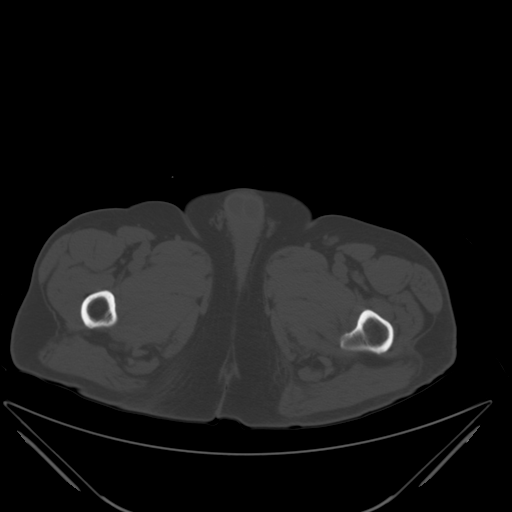
[im 15/115  soft-tissue]
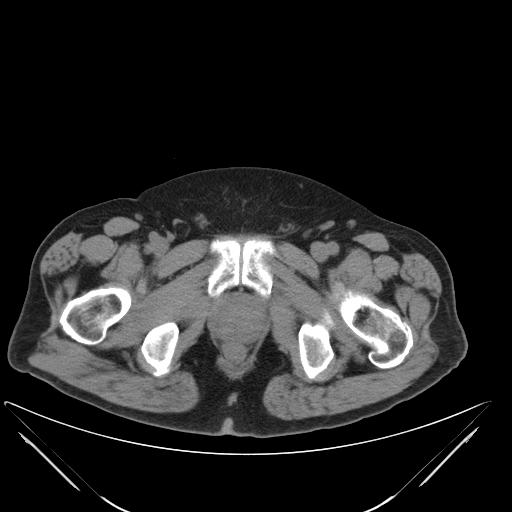
[im 24/115  soft-tissue]
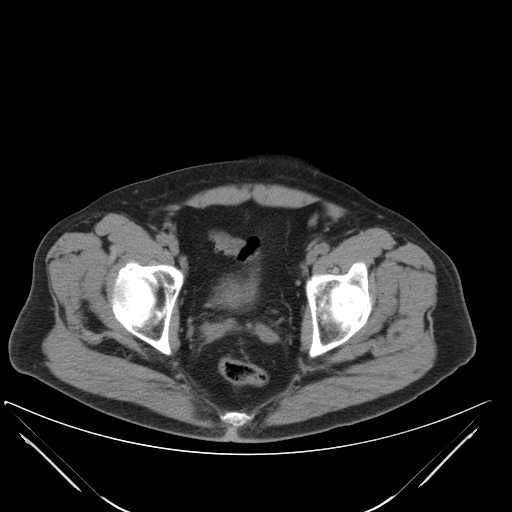
[im 34/115  soft-tissue]
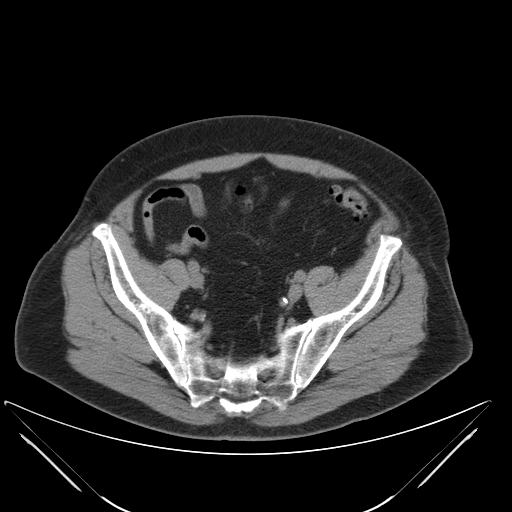
[im 39/115  soft-tissue]
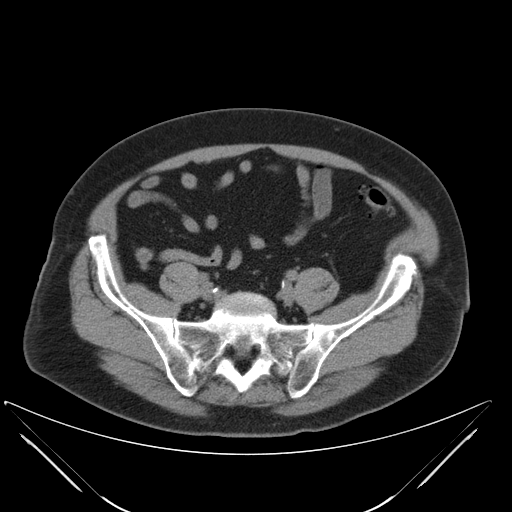
[im 48/115  soft-tissue]
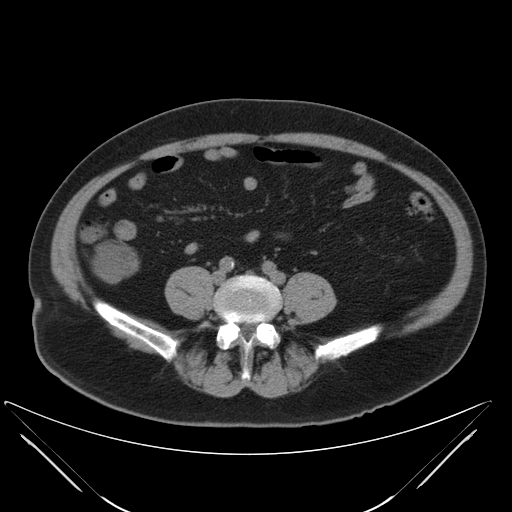
[im 58/115  soft-tissue]
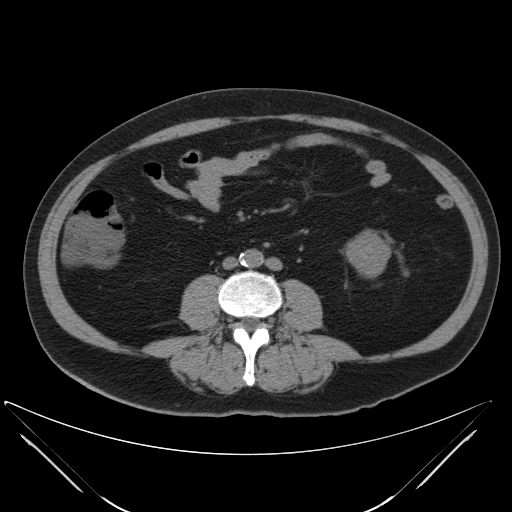
[im 67/115  soft-tissue]
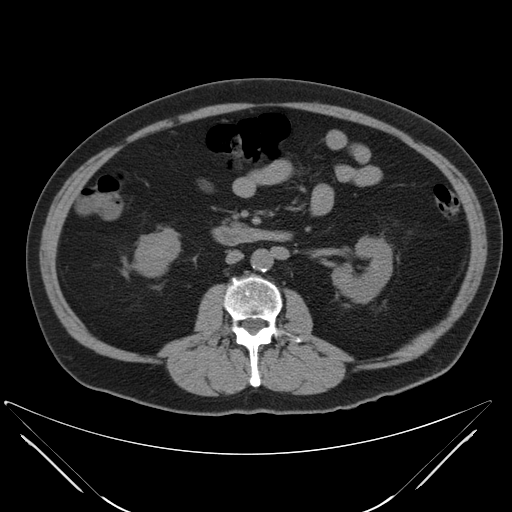
[im 77/115  soft-tissue]
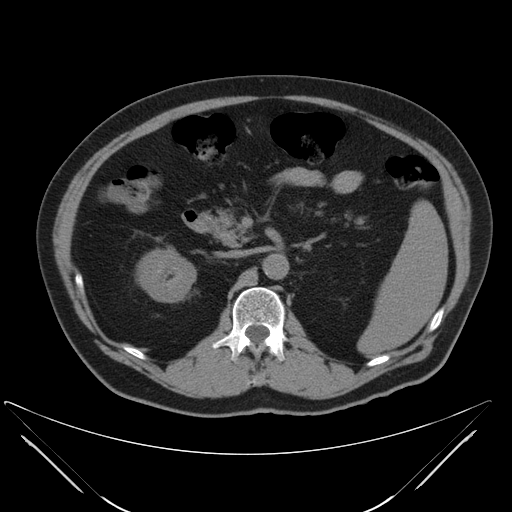
[im 77/115  bone]
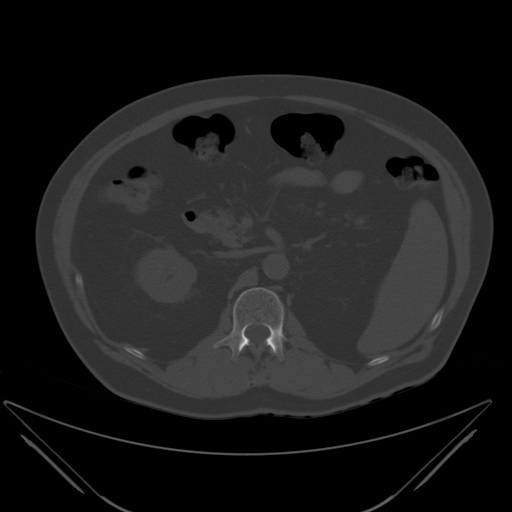
[im 81/115  soft-tissue]
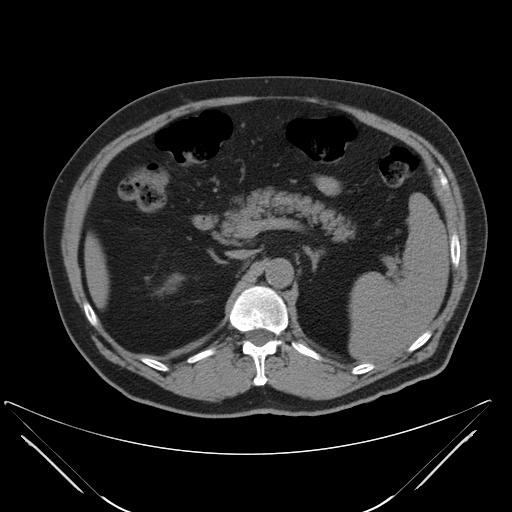
[im 91/115  soft-tissue]
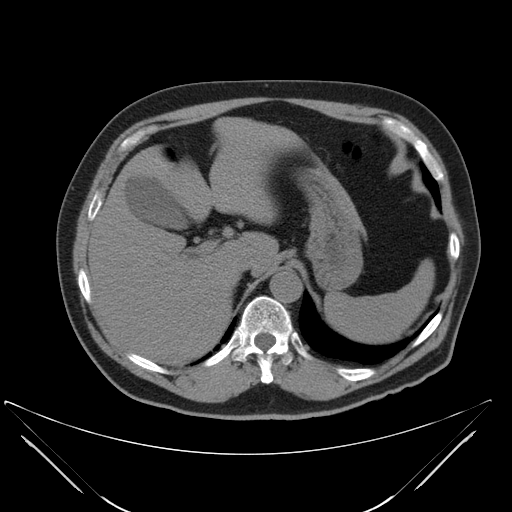
[im 100/115  soft-tissue]
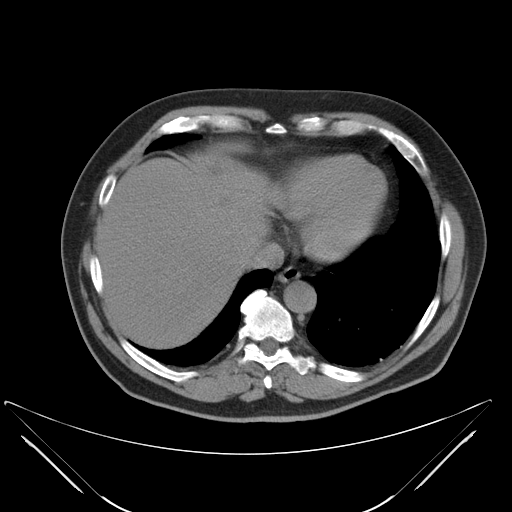
[im 110/115  soft-tissue]
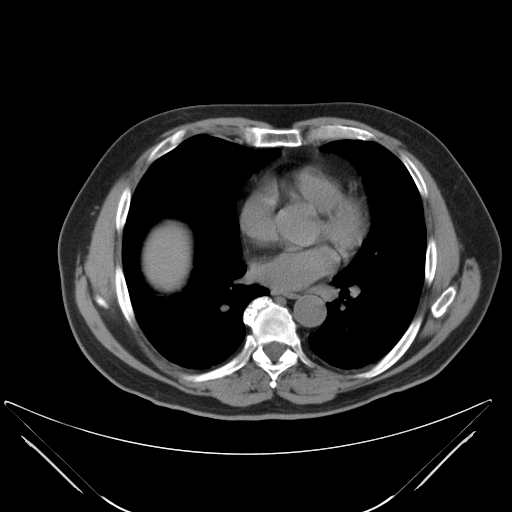

[coronals · coronal · 1.11mm/px · 3 of 106 slices shown]
[im 36/106  soft-tissue]
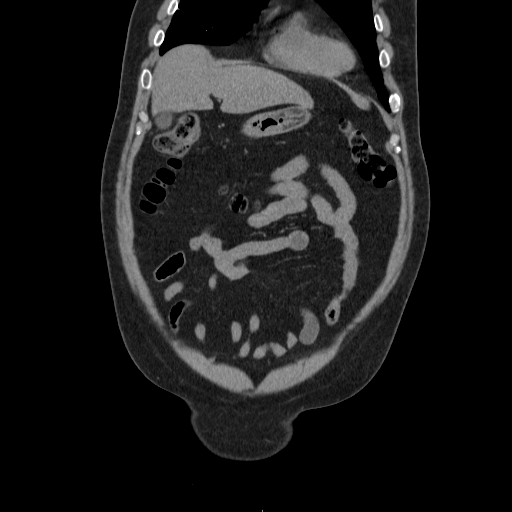
[im 47/106  soft-tissue]
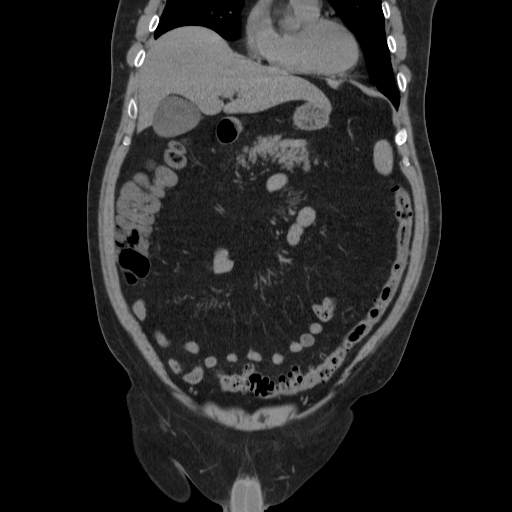
[im 59/106  soft-tissue]
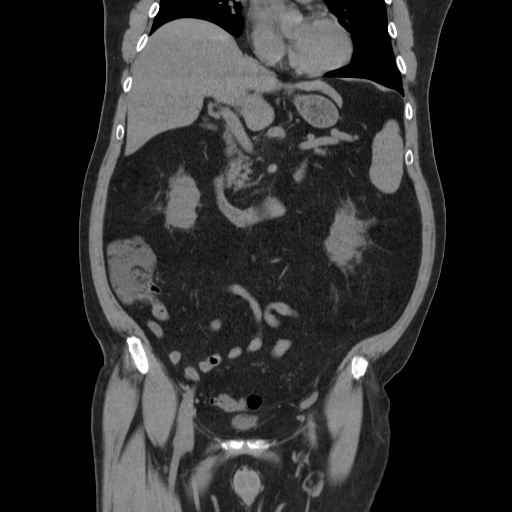

[16 of 46 positions shown; findings below may reference images not displayed]

FINDINGS: The lack of intravenous contrast limits the ability to evaluate
solid abdominal organs.

There are bilateral nonobstructing renal stones. Dominant stone
within the superior pole of the left kidney measures approximately
1.0 x 0.8 cm (image 47, series 2) while and additional dominant
stone within the inferior pole of the left kidney measures
approximate 1.2 x 0.8 cm (image 56, series 2).

There is a punctate (approximately 4 mm) nonobstructing stone
within the inferior pole of the right kidney (image 48, series 2).
There are additional smaller punctate (approximately 2 mm) stones
within the superior pole of the right kidney (coronal image 73) as
well as the mid aspect of the right kidney (coronal image 68).

No stones are seen along the expected course of either ureter or
within the urinary bladder.  Normal noncontrast appearance of the
urinary bladder giving degree of distension.  Grossly symmetric
likely age related perinephric stranding.  No urinary obstruction.

----------------------------------------------------------

Normal hepatic contour.  There is an approximately 1.7 x 1.0 cm
stone within the otherwise normal appearing gallbladder.  No
definite gallbladder wall thickening or pericholecystic fluid.  No
ascites.

Normal noncontrast appearance of the bilateral adrenal glands,
pancreas and spleen.

Extensive colonic diverticulosis without evidence of diverticulitis
on this noncontrast examination.  The bowel is otherwise normal in
course and caliber without wall thickening.  Normal noncontrast
appearance of the appendix.  No pneumoperitoneum, pneumatosis or
portal venous gas.

Scattered atherosclerotic plaque within a normal caliber abdominal
aorta.  No definite bulky retroperitoneal, mesenteric, pelvic or
inguinal lymphadenopathy on this noncontrast examination.

Borderline enlarged prostate.  No free fluid in the pelvis.

Limited visualization of the lower thorax demonstrates scattered
calcified bilateral punctate granulomas, the sequela of prior
granulomas infection.  No focal airspace opacity.  No pleural
effusion.  Normal heart size.  No pericardial effusion.

No acute or aggressive osseous abnormalities.
IMPRESSION: 1.  Bilateral nonobstructing nephrolithiasis, left greater than
right.  No evidence of urinary obstruction.
2.  Cholelithiasis without definite evidence of acute cholecystitis
on this noncontrast examination.

3.  Colonic diverticulosis without evidence of diverticulitis

## 2016-01-26 DIAGNOSIS — E1129 Type 2 diabetes mellitus with other diabetic kidney complication: Secondary | ICD-10-CM | POA: Diagnosis not present

## 2016-01-26 DIAGNOSIS — I10 Essential (primary) hypertension: Secondary | ICD-10-CM | POA: Diagnosis not present

## 2016-01-26 DIAGNOSIS — M1A9XX1 Chronic gout, unspecified, with tophus (tophi): Secondary | ICD-10-CM | POA: Diagnosis not present

## 2016-01-26 DIAGNOSIS — N289 Disorder of kidney and ureter, unspecified: Secondary | ICD-10-CM | POA: Diagnosis not present

## 2016-01-26 DIAGNOSIS — E039 Hypothyroidism, unspecified: Secondary | ICD-10-CM | POA: Diagnosis not present

## 2016-09-23 ENCOUNTER — Ambulatory Visit (INDEPENDENT_AMBULATORY_CARE_PROVIDER_SITE_OTHER): Payer: PRIVATE HEALTH INSURANCE | Admitting: Physician Assistant

## 2016-09-23 VITALS — BP 110/68 | HR 76 | Temp 98.3°F | Resp 16 | Ht 68.0 in | Wt 212.0 lb

## 2016-09-23 DIAGNOSIS — J069 Acute upper respiratory infection, unspecified: Secondary | ICD-10-CM

## 2016-09-23 MED ORDER — ACETAMINOPHEN 500 MG PO TABS
1000.0000 mg | ORAL_TABLET | Freq: Three times a day (TID) | ORAL | 99 refills | Status: AC | PRN
Start: 2016-09-23 — End: 2017-09-23

## 2016-09-23 MED ORDER — PHENYLEPHRINE-CHLORPHEN-DM 3.5-1-3 MG/ML PO LIQD: 1.0000 mL | Freq: Four times a day (QID) | ORAL | 0 refills | Status: DC | PRN

## 2016-09-23 MED ORDER — AZITHROMYCIN 250 MG PO TABS: ORAL_TABLET | ORAL | 0 refills | Status: AC

## 2016-09-23 NOTE — Patient Instructions (Signed)
     IF you received an x-ray today, you will receive an invoice from [facility]. Please contact [facility] at [phone] with questions or concerns regarding your invoice.   IF you received labwork today, you will receive an invoice from [facility]. Please contact [facility] at [phone] with questions or concerns regarding your invoice.   Our billing staff will not be able to assist you with questions regarding bills from these companies.  You will be contacted with the lab results as soon as they are available. The fastest way to get your results is to activate your My Chart account. Instructions are located on the last page of this paperwork. If you have not heard from us regarding the results in 2 weeks, please contact this office.      

## 2016-09-23 NOTE — Progress Notes (Signed)
09/23/2016 3:24 PM   DOB: 05/20/1946 / MRN: [id number]  SUBJECTIVE:  Victor Hutchinson is a well appearing 70 y.o. male with a history of DM2 presenting for a head cold.  This started about 30 days ago in which he was sick for about a week, his symptoms then mostly resolved for about two weeks, and now his cough, nasal congestion, rhinorrhea and sore throat have returned for the last three days. He has not tried any medication.  He did not get the flu shot and he does not want this.      He has No Known Allergies.   He  has a past medical history of Diabetes mellitus without complication (HCC); Gout (06/06/2013); Hypertension; Hypothyroidism; Nephrolithiasis (06/06/2013); and Other and unspecified hyperlipidemia (06/06/2013).    He  reports that he has never smoked. He does not have any smokeless tobacco history on file. He reports that he does not drink alcohol or use drugs. He  reports that he currently engages in sexual activity. The patient  has no past surgical history on file.  His family history includes Heart disease in his sister.  Review of Systems  Constitutional: Negative for chills and fever.  HENT: Negative for hearing loss.   Respiratory: Negative for shortness of breath.   Cardiovascular: Negative for chest pain and leg swelling.  Musculoskeletal: Negative for myalgias.  Skin: Negative for itching and rash.  Neurological: Negative for dizziness.    The problem list and medications were reviewed and updated by myself where necessary and exist elsewhere in the encounter.   OBJECTIVE:  BP 110/68   Pulse 76   Temp 98.3 F (36.8 C) (Oral)   Resp 16   Ht 5\' 8"  (1.727 m)   Wt 212 lb (96.2 kg)   SpO2 98%   BMI 32.23 kg/m   Physical Exam  Constitutional: He is oriented to person, place, and time. He appears well-developed. He does not appear ill.  HENT:  Right Ear: Tympanic membrane and external ear normal.  Left Ear: Tympanic membrane and external ear normal.  Nose:  Nose normal.  Mouth/Throat: Uvula is midline, oropharynx is clear and moist and mucous membranes are normal. No oropharyngeal exudate.  Eyes: Conjunctivae and EOM are normal. Pupils are equal, round, and reactive to light.  Cardiovascular: Normal rate and regular rhythm.   Pulmonary/Chest: Effort normal and breath sounds normal.  Abdominal: Soft. Bowel sounds are normal.  Musculoskeletal: Normal range of motion.  Neurological: He is alert and oriented to person, place, and time. No cranial nerve deficit. Coordination normal.  Skin: Skin is warm and dry. He is not diaphoretic.  Psychiatric: He has a normal mood and affect.  Nursing note and vitals reviewed.   Lab Results  Component Value Date   HGBA1C 6.8 (H) 06/06/2013   No results found for this or any previous visit (from the past 72 hour(s)).  No results found.  ASSESSMENT AND PLAN  Victor Hutchinson was seen today for nasal congestion, cough and headache.  Diagnoses and all orders for this visit:  Acute URI Comments: Viral vs. bacterial vs. allergic.  Will cover for problem one and three for now.  Zpack printed for him in the event his symptoms do not remiss.  Orders: -     chlorpheniramine-phenylephrine-dextromethorphan (CARDEC DM) 3.5-1-3 MG/ML solution; Take 1 mL by mouth every 6 (six) hours as needed for cough. -     acetaminophen (TYLENOL) 500 MG tablet; Take 2 tablets (1,000 mg total) by mouth  every 8 (eight) hours as needed for mild pain, moderate pain or fever. Take 2 tabs every 8 hours. -     azithromycin (ZITHROMAX) 250 MG tablet; Take 2 tabs PO x 1 dose, then 1 tab PO QD x 4 days. Fill if symptoms are not improving.    The patient is advised to call or return to clinic if he does not see an improvement in symptoms, or to seek the care of the closest emergency department if he worsens with the above plan.   Deliah BostonMichael Kadajah Kjos, MHS, PA-C [facility] 09/23/2016 3:24 PM

## 2017-03-01 ENCOUNTER — Ambulatory Visit (INDEPENDENT_AMBULATORY_CARE_PROVIDER_SITE_OTHER): Payer: Medicare Other | Admitting: Emergency Medicine

## 2017-03-01 ENCOUNTER — Encounter: Payer: Self-pay | Admitting: Emergency Medicine

## 2017-03-01 VITALS — BP 134/73 | HR 83 | Temp 98.1°F | Resp 16 | Ht 68.0 in | Wt 220.6 lb

## 2017-03-01 DIAGNOSIS — J069 Acute upper respiratory infection, unspecified: Secondary | ICD-10-CM | POA: Diagnosis not present

## 2017-03-01 DIAGNOSIS — R05 Cough: Secondary | ICD-10-CM

## 2017-03-01 DIAGNOSIS — R0989 Other specified symptoms and signs involving the circulatory and respiratory systems: Secondary | ICD-10-CM | POA: Diagnosis not present

## 2017-03-01 DIAGNOSIS — R059 Cough, unspecified: Secondary | ICD-10-CM | POA: Insufficient documentation

## 2017-03-01 MED ORDER — PROMETHAZINE-CODEINE 6.25-10 MG/5ML PO SYRP: 5.0000 mL | ORAL_SOLUTION | Freq: Every evening | ORAL | 0 refills | Status: DC | PRN

## 2017-03-01 MED ORDER — AMOXICILLIN-POT CLAVULANATE 875-125 MG PO TABS: 1.0000 | ORAL_TABLET | Freq: Two times a day (BID) | ORAL | 0 refills | Status: AC

## 2017-03-01 MED ORDER — PREDNISONE 20 MG PO TABS: 20.0000 mg | ORAL_TABLET | Freq: Every day | ORAL | 0 refills | Status: AC

## 2017-03-01 MED ORDER — BENZONATATE 200 MG PO CAPS: 200.0000 mg | ORAL_CAPSULE | Freq: Two times a day (BID) | ORAL | 0 refills | Status: DC | PRN

## 2017-03-01 NOTE — Patient Instructions (Addendum)
     IF you received an x-ray today, you will receive an invoice from [facility]. Please contact [facility] at [phone] with questions or concerns regarding your invoice.   IF you received labwork today, you will receive an invoice from [facility]. Please contact [facility] at [phone] with questions or concerns regarding your invoice.   Our billing staff will not be able to assist you with questions regarding bills from these companies.  You will be contacted with the lab results as soon as they are available. The fastest way to get your results is to activate your My Chart account. Instructions are located on the last page of this paperwork. If you have not heard from us regarding the results in 2 weeks, please contact this office.      Upper Respiratory Infection, Adult Most upper respiratory infections (URIs) are caused by a virus. A URI affects the nose, throat, and upper air passages. The most common type of URI is often called "the common cold." Follow these instructions at home:  Take medicines only as told by your doctor.  Gargle warm saltwater or take cough drops to comfort your throat as told by your doctor.  Use a warm mist humidifier or inhale steam from a shower to increase air moisture. This may make it easier to breathe.  Drink enough fluid to keep your pee (urine) clear or pale yellow.  Eat soups and other clear broths.  Have a healthy diet.  Rest as needed.  Go back to work when your fever is gone or your doctor says it is okay.  You may need to stay home longer to avoid giving your URI to others.  You can also wear a face mask and wash your hands often to prevent spread of the virus.  Use your inhaler more if you have asthma.  Do not use any tobacco products, including cigarettes, chewing tobacco, or electronic cigarettes. If you need help quitting, ask your doctor. Contact a doctor if:  You are getting worse, not better.  Your  symptoms are not helped by medicine.  You have chills.  You are getting more short of breath.  You have brown or red mucus.  You have yellow or brown discharge from your nose.  You have pain in your face, especially when you bend forward.  You have a fever.  You have puffy (swollen) neck glands.  You have pain while swallowing.  You have white areas in the back of your throat. Get help right away if:  You have very bad or constant:  Headache.  Ear pain.  Pain in your forehead, behind your eyes, and over your cheekbones (sinus pain).  Chest pain.  You have long-lasting (chronic) lung disease and any of the following:  Wheezing.  Long-lasting cough.  Coughing up blood.  A change in your usual mucus.  You have a stiff neck.  You have changes in your:  Vision.  Hearing.  Thinking.  Mood. This information is not intended to replace advice given to you by your health care provider. Make sure you discuss any questions you have with your health care provider. Document Released: 04/17/2008 Document Revised: 07/02/2016 Document Reviewed: 02/04/2014 Elsevier Interactive Patient Education  2017 Elsevier Inc.  

## 2017-03-01 NOTE — Progress Notes (Signed)
Victor Hutchinson 71 y.o.   Chief Complaint  Patient presents with  . Cough    Began Sunday   . Nasal Congestion  . Fatigue    HISTORY OF PRESENT ILLNESS: This is a 71 y.o. male complaining of cough and congestion x 4 days.  URI   This is a new problem. The current episode started in the past 7 days. The problem has been gradually worsening. There has been no fever. Associated symptoms include congestion, coughing and sinus pain. Pertinent negatives include no abdominal pain, chest pain, diarrhea, dysuria, ear pain, headaches, nausea, neck pain, rash, sore throat, swollen glands, vomiting or wheezing.     Prior to Admission medications   Medication Sig Start Date End Date Taking? Authorizing Provider  acetaminophen (TYLENOL) 500 MG tablet Take 2 tablets (1,000 mg total) by mouth every 8 (eight) hours as needed for mild pain, moderate pain or fever. Take 2 tabs every 8 hours. 09/23/16 09/23/17 Yes Ofilia Neas, PA-C  amLODipine (NORVASC) 5 MG tablet Take 5 mg by mouth daily.   Yes Historical Provider, MD  febuxostat (ULORIC) 40 MG tablet Take 40 mg by mouth daily.   Yes Historical Provider, MD  glipiZIDE (GLUCOTROL) 5 MG tablet Take 5 mg by mouth every morning.    Yes Historical Provider, MD  levothyroxine (SYNTHROID, LEVOTHROID) 25 MCG tablet Take 1 tablet (25 mcg total) by mouth daily. PATIENT NEEDS OFFICE VISIT FOR ADDITIONAL REFILLS   Yes Eleanore E Egan, PA-C  losartan (COZAAR) 100 MG tablet Take 100 mg by mouth daily.   Yes Historical Provider, MD  tamsulosin (FLOMAX) 0.4 MG CAPS capsule Take 1 capsule (0.4 mg total) by mouth daily after supper. 07/12/13  Yes Collene Gobble, MD  chlorpheniramine-phenylephrine-dextromethorphan (CARDEC DM) 3.5-1-3 MG/ML solution Take 1 mL by mouth every 6 (six) hours as needed for cough. Patient not taking: Reported on 03/01/2017 09/23/16   Ofilia Neas, PA-C    No Known Allergies  Patient Active Problem List   Diagnosis Date Noted  . UTI  (urinary tract infection) 06/06/2013  . Nephrolithiasis 06/06/2013  . Type II diabetes mellitus (HCC) 06/06/2013  . Essential hypertension, benign 06/06/2013  . Other and unspecified hyperlipidemia 06/06/2013  . Unspecified hypothyroidism 06/06/2013    Past Medical History:  Diagnosis Date  . Diabetes mellitus without complication (HCC)   . Gout 06/06/2013  . Hypertension   . Hypothyroidism   . Nephrolithiasis 06/06/2013  . Other and unspecified hyperlipidemia 06/06/2013    No past surgical history on file.  Social History   Social History  . Marital status: Married    Spouse name: N/A  . Number of children: N/A  . Years of education: N/A   Occupational History  . Not on file.   Social History Main Topics  . Smoking status: Never Smoker  . Smokeless tobacco: Not on file  . Alcohol use No  . Drug use: No  . Sexual activity: Yes   Other Topics Concern  . Not on file   Social History Narrative  . No narrative on file    Family History  Problem Relation Age of Onset  . Heart disease Sister      Review of Systems  Constitutional: Negative.  Negative for chills, fever and malaise/fatigue.  HENT: Positive for congestion and sinus pain. Negative for ear pain and sore throat.   Eyes: Negative for discharge and redness.  Respiratory: Positive for cough. Negative for shortness of breath and wheezing.  Cardiovascular: Negative for chest pain, palpitations and leg swelling.  Gastrointestinal: Negative for abdominal pain, diarrhea, nausea and vomiting.  Genitourinary: Negative for dysuria and hematuria.  Musculoskeletal: Negative for back pain, myalgias and neck pain.  Skin: Negative.  Negative for rash.  Neurological: Negative for dizziness and headaches.  Endo/Heme/Allergies: Negative.   All other systems reviewed and are negative.   Vitals:   03/01/17 1059  BP: 134/73  Pulse: 83  Resp: 16  Temp: 98.1 F (36.7 C)    Physical Exam  Constitutional: He is  oriented to person, place, and time. He appears well-developed and well-nourished.  HENT:  Head: Normocephalic and atraumatic.  Nose: Nose normal.  Mouth/Throat: No oropharyngeal exudate.  Eyes: Conjunctivae and EOM are normal. Pupils are equal, round, and reactive to light.  Neck: Normal range of motion. Neck supple. No JVD present. No thyromegaly present.  Cardiovascular: Normal rate, regular rhythm and normal heart sounds.   Pulmonary/Chest: Effort normal and breath sounds normal.  Abdominal: Soft. Bowel sounds are normal. He exhibits no distension. There is no tenderness.  Musculoskeletal: Normal range of motion.  Lymphadenopathy:    He has no cervical adenopathy.  Neurological: He is alert and oriented to person, place, and time. No sensory deficit. He exhibits normal muscle tone.  Skin: Skin is warm and dry. Capillary refill takes less than 2 seconds.  Psychiatric: He has a normal mood and affect. His behavior is normal.  Vitals reviewed.    ASSESSMENT & PLAN: Victor Hutchinson was seen today for cough, nasal congestion and fatigue.  Diagnoses and all orders for this visit:  Acute URI  Cough  Chest congestion  Other orders -     benzonatate (TESSALON) 200 MG capsule; Take 1 capsule (200 mg total) by mouth 2 (two) times daily as needed for cough. -     promethazine-codeine (PHENERGAN WITH CODEINE) 6.25-10 MG/5ML syrup; Take 5 mLs by mouth at bedtime as needed for cough. -     predniSONE (DELTASONE) 20 MG tablet; Take 1 tablet (20 mg total) by mouth daily with breakfast. -     amoxicillin-clavulanate (AUGMENTIN) 875-125 MG tablet; Take 1 tablet by mouth 2 (two) times daily.    Patient Instructions       IF you received an x-ray today, you will receive an invoice from [facility]. Please contact [facility] at [phone] with questions or concerns regarding your invoice.   IF you received labwork today, you will receive an invoice from [facility]. Please  contact [facility] at [phone] with questions or concerns regarding your invoice.   Our billing staff will not be able to assist you with questions regarding bills from these companies.  You will be contacted with the lab results as soon as they are available. The fastest way to get your results is to activate your My Chart account. Instructions are located on the last page of this paperwork. If you have not heard from [place] regarding the results in 2 weeks, please contact this office.      Upper Respiratory Infection, Adult Most upper respiratory infections (URIs) are caused by a virus. A URI affects the nose, throat, and upper air passages. The most common type of URI is often called "the common cold." Follow these instructions at home:  Take medicines only as told by your doctor.  Gargle warm saltwater or take cough drops to comfort your throat as told by your doctor.  Use a warm mist humidifier or inhale steam from a shower to increase  air moisture. This may make it easier to breathe.  Drink enough fluid to keep your pee (urine) clear or pale yellow.  Eat soups and other clear broths.  Have a healthy diet.  Rest as needed.  Go back to work when your fever is gone or your doctor says it is okay.  You may need to stay home longer to avoid giving your URI to others.  You can also wear a face mask and wash your hands often to prevent spread of the virus.  Use your inhaler more if you have asthma.  Do not use any tobacco products, including cigarettes, chewing tobacco, or electronic cigarettes. If you need help quitting, ask your doctor. Contact a doctor if:  You are getting worse, not better.  Your symptoms are not helped by medicine.  You have chills.  You are getting more short of breath.  You have brown or red mucus.  You have yellow or brown discharge from your nose.  You have pain in your face, especially when you bend forward.  You have a fever.  You have  puffy (swollen) neck glands.  You have pain while swallowing.  You have white areas in the back of your throat. Get help right away if:  You have very bad or constant:  Headache.  Ear pain.  Pain in your forehead, behind your eyes, and over your cheekbones (sinus pain).  Chest pain.  You have long-lasting (chronic) lung disease and any of the following:  Wheezing.  Long-lasting cough.  Coughing up blood.  A change in your usual mucus.  You have a stiff neck.  You have changes in your:  Vision.  Hearing.  Thinking.  Mood. This information is not intended to replace advice given to you by your health care provider. Make sure you discuss any questions you have with your health care provider. Document Released: 04/17/2008 Document Revised: 07/02/2016 Document Reviewed: 02/04/2014 Elsevier Interactive Patient Education  2017 Elsevier Inc.      Edwina Barth, MD [facility]

## 2017-03-10 ENCOUNTER — Ambulatory Visit (HOSPITAL_COMMUNITY)
Admission: EM | Admit: 2017-03-10 | Discharge: 2017-03-10 | Disposition: A | Discharge status: Home / Self Care | Payer: Medicare Other | Attending: Family Medicine | Admitting: Family Medicine

## 2017-03-10 ENCOUNTER — Encounter (HOSPITAL_COMMUNITY): Payer: Self-pay | Admitting: Emergency Medicine

## 2017-03-10 DIAGNOSIS — K120 Recurrent oral aphthae: Secondary | ICD-10-CM | POA: Diagnosis not present

## 2017-03-10 DIAGNOSIS — B370 Candidal stomatitis: Secondary | ICD-10-CM | POA: Diagnosis not present

## 2017-03-10 MED ORDER — NYSTATIN 100000 UNIT/ML MT SUSP: 500000.0000 [IU] | Freq: Four times a day (QID) | OROMUCOSAL | 0 refills | Status: DC

## 2017-03-10 NOTE — Discharge Instructions (Signed)
Make sure you schedule an appointment with your PCP. There are usually some labs to get in the setting of this to rule out underlying sinister causes. If your doctor has already ordered these labs, you may not need the labs.  Use swish for 5-7 days after thrush has resolved.

## 2017-03-10 NOTE — ED Provider Notes (Signed)
[facility]    CSN: [id number] Arrival date & time: 03/10/17  1538     History   Chief Complaint Chief Complaint  Patient presents with  . Thrush    HPI Victor Hutchinson is a 71 y.o. male.   HPI Patient resents with a three-day history of thrush in his mouth. He recently completed a course of Augmentin for an upper respiratory infection. He did have an episode of thrush around 15 years ago and states it is exactly the same. He is having some pain underneath his tongue. No trouble swallowing or pain with swallowing. No recent illness, history of HIV, weight loss, or recent steroid use.  Past Medical History:  Diagnosis Date  . Diabetes mellitus without complication (HCC)   . Gout 06/06/2013  . Hypertension   . Hypothyroidism   . Nephrolithiasis 06/06/2013  . Other and unspecified hyperlipidemia 06/06/2013    Patient Active Problem List   Diagnosis Date Noted  . Acute URI 03/01/2017  . Cough 03/01/2017  . Chest congestion 03/01/2017  . UTI (urinary tract infection) 06/06/2013  . Nephrolithiasis 06/06/2013  . Type II diabetes mellitus (HCC) 06/06/2013  . Essential hypertension, benign 06/06/2013  . Other and unspecified hyperlipidemia 06/06/2013  . Unspecified hypothyroidism 06/06/2013    History reviewed. No pertinent surgical history.     Home Medications    Prior to Admission medications   Medication Sig Start Date End Date Taking? Authorizing Provider  amLODipine (NORVASC) 5 MG tablet Take 5 mg by mouth daily.   Yes Historical Provider, MD  glipiZIDE (GLUCOTROL) 5 MG tablet Take 5 mg by mouth every morning.    Yes Historical Provider, MD  levothyroxine (SYNTHROID, LEVOTHROID) 25 MCG tablet Take 1 tablet (25 mcg total) by mouth daily. PATIENT NEEDS OFFICE VISIT FOR ADDITIONAL REFILLS   Yes Eleanore E Egan, PA-C  losartan (COZAAR) 100 MG tablet Take 100 mg by mouth daily.   Yes Historical Provider, MD  tamsulosin (FLOMAX) 0.4 MG CAPS capsule Take 1  capsule (0.4 mg total) by mouth daily after supper. 07/12/13  Yes Collene Gobble, MD  acetaminophen (TYLENOL) 500 MG tablet Take 2 tablets (1,000 mg total) by mouth every 8 (eight) hours as needed for mild pain, moderate pain or fever. Take 2 tabs every 8 hours. 09/23/16 09/23/17  Ofilia Neas, PA-C  benzonatate (TESSALON) 200 MG capsule Take 1 capsule (200 mg total) by mouth 2 (two) times daily as needed for cough. 03/01/17   Georgina Quint, MD  chlorpheniramine-phenylephrine-dextromethorphan (CARDEC DM) 3.5-1-3 MG/ML solution Take 1 mL by mouth every 6 (six) hours as needed for cough. Patient not taking: Reported on 03/01/2017 09/23/16   Ofilia Neas, PA-C  febuxostat (ULORIC) 40 MG tablet Take 40 mg by mouth daily.    Historical Provider, MD  nystatin (MYCOSTATIN) 100000 UNIT/ML suspension Take 5 mLs (500,000 Units total) by mouth 4 (four) times daily. 03/10/17   Sharlene Dory, DO  promethazine-codeine (PHENERGAN WITH CODEINE) 6.25-10 MG/5ML syrup Take 5 mLs by mouth at bedtime as needed for cough. 03/01/17   Georgina Quint, MD    Family History Family History  Problem Relation Age of Onset  . Heart disease Sister     Social History Social History  Substance Use Topics  . Smoking status: Never Smoker  . Smokeless tobacco: Never Used  . Alcohol use No     Allergies   Patient has no known allergies.   Review of Systems Review of Systems  Constitutional: Negative for fever.  HENT:       +thrush in mouth      Physical Exam Triage Vital Signs ED Triage Vitals [03/10/17 1613]  Enc Vitals Group     BP (!) 143/75     Pulse Rate 81     Resp 16     Temp 98.5 F (36.9 C)     Temp Source Oral     SpO2 97 %   Updated Vital Signs BP (!) 143/75 (BP Location: Left Arm)   Pulse 81   Temp 98.5 F (36.9 C) (Oral)   Resp 16   SpO2 97%   Physical Exam  Constitutional: He appears well-developed and well-nourished.  HENT:  Head: Normocephalic and  atraumatic.  Nose: Nose normal.  Mouth/Throat: No oropharyngeal exudate.  Aphthous ulcer R subglossal region, no drainage; posterior tongue with white film over area  Neck: Normal range of motion. Neck supple.  Cardiovascular: Normal rate and regular rhythm.   No murmur heard. Pulmonary/Chest: Effort normal and breath sounds normal. No respiratory distress.  Skin: Skin is warm and dry.  Psychiatric: He has a normal mood and affect. Judgment normal.     UC Treatments / Results  Procedures Procedures none  Initial Impression / Assessment and Plan / UC Course  I have reviewed the triage vital signs and the nursing notes.  Pertinent labs & imaging results that were available during my care of the patient were reviewed by me and considered in my medical decision making (see chart for details).     Will treat for thrush. Recommended following up with PCP for further evaluation and ruling out HIV and other conditions related to thrush. Does not sound like it is affecting esophagus. Pt voiced understanding and agreement to the plan.   Final Clinical Impressions(s) / UC Diagnoses   Final diagnoses:  Oral thrush  Aphthous ulcer    New Prescriptions Discharge Medication List as of 03/10/2017  4:34 PM    START taking these medications   Details  nystatin (MYCOSTATIN) 100000 UNIT/ML suspension Take 5 mLs (500,000 Units total) by mouth 4 (four) times daily., Starting Sat 03/10/2017, Normal         Jilda Roche [place], [place] 03/10/17 [phone]

## 2017-03-10 NOTE — ED Triage Notes (Signed)
Here for oral thrush x3 days .... Reports he just finished Augmentin given by PCP  A&O x4... NAD

## 2017-06-26 ENCOUNTER — Ambulatory Visit: Payer: Medicare Other | Admitting: Family Medicine

## 2018-03-21 DIAGNOSIS — M7582 Other shoulder lesions, left shoulder: Secondary | ICD-10-CM | POA: Diagnosis not present

## 2018-03-21 DIAGNOSIS — E669 Obesity, unspecified: Secondary | ICD-10-CM | POA: Diagnosis not present

## 2018-03-21 DIAGNOSIS — M25512 Pain in left shoulder: Secondary | ICD-10-CM | POA: Diagnosis not present

## 2018-03-21 DIAGNOSIS — Z6831 Body mass index (BMI) 31.0-31.9, adult: Secondary | ICD-10-CM | POA: Diagnosis not present

## 2018-03-21 DIAGNOSIS — M1A09X1 Idiopathic chronic gout, multiple sites, with tophus (tophi): Secondary | ICD-10-CM | POA: Diagnosis not present

## 2018-12-20 DIAGNOSIS — H5203 Hypermetropia, bilateral: Secondary | ICD-10-CM | POA: Diagnosis not present

## 2018-12-20 DIAGNOSIS — H52223 Regular astigmatism, bilateral: Secondary | ICD-10-CM | POA: Diagnosis not present

## 2018-12-20 DIAGNOSIS — E119 Type 2 diabetes mellitus without complications: Secondary | ICD-10-CM | POA: Diagnosis not present

## 2018-12-20 DIAGNOSIS — H524 Presbyopia: Secondary | ICD-10-CM | POA: Diagnosis not present

## 2019-01-24 DIAGNOSIS — E119 Type 2 diabetes mellitus without complications: Secondary | ICD-10-CM | POA: Diagnosis not present

## 2019-01-24 DIAGNOSIS — H25813 Combined forms of age-related cataract, bilateral: Secondary | ICD-10-CM | POA: Diagnosis not present

## 2019-09-29 DIAGNOSIS — E119 Type 2 diabetes mellitus without complications: Secondary | ICD-10-CM | POA: Diagnosis not present

## 2019-09-29 DIAGNOSIS — H25813 Combined forms of age-related cataract, bilateral: Secondary | ICD-10-CM | POA: Diagnosis not present

## 2019-10-24 DIAGNOSIS — H25811 Combined forms of age-related cataract, right eye: Secondary | ICD-10-CM | POA: Diagnosis not present

## 2019-10-24 DIAGNOSIS — H52221 Regular astigmatism, right eye: Secondary | ICD-10-CM | POA: Diagnosis not present

## 2019-11-21 DIAGNOSIS — H25812 Combined forms of age-related cataract, left eye: Secondary | ICD-10-CM | POA: Diagnosis not present

## 2019-11-25 DIAGNOSIS — H35351 Cystoid macular degeneration, right eye: Secondary | ICD-10-CM | POA: Diagnosis not present

## 2019-12-02 DIAGNOSIS — H35351 Cystoid macular degeneration, right eye: Secondary | ICD-10-CM | POA: Diagnosis not present

## 2019-12-05 ENCOUNTER — Ambulatory Visit: Payer: Medicare Other | Attending: Internal Medicine

## 2019-12-05 DIAGNOSIS — Z23 Encounter for immunization: Secondary | ICD-10-CM | POA: Insufficient documentation

## 2019-12-05 NOTE — Progress Notes (Signed)
   Covid-19 Vaccination Clinic  Name:  Victor Hutchinson    MRN: [id number] DOB: [birth year]  12/05/2019  Victor Hutchinson was observed post Covid-19 immunization for 15 minutes without incidence. He was provided with Vaccine Information Sheet and instruction to access the V-Safe system.   Victor Hutchinson was instructed to call 911 with any severe reactions post vaccine: Marland Kitchen Difficulty breathing  . Swelling of your face and throat  . A fast heartbeat  . A bad rash all over your body  . Dizziness and weakness    Immunizations Administered    Name Date Dose VIS Date Route   Pfizer COVID-19 Vaccine 12/05/2019  6:14 PM 0.3 mL 10/24/2019 Intramuscular   Manufacturer: [employer], [employer]   Lot: [id number]   NDC: [address]-8

## 2019-12-10 DIAGNOSIS — H35351 Cystoid macular degeneration, right eye: Secondary | ICD-10-CM | POA: Diagnosis not present

## 2019-12-10 DIAGNOSIS — Z961 Presence of intraocular lens: Secondary | ICD-10-CM | POA: Diagnosis not present

## 2019-12-10 DIAGNOSIS — H43821 Vitreomacular adhesion, right eye: Secondary | ICD-10-CM | POA: Diagnosis not present

## 2019-12-26 ENCOUNTER — Ambulatory Visit: Payer: Medicare HMO | Attending: Internal Medicine

## 2019-12-26 DIAGNOSIS — Z23 Encounter for immunization: Secondary | ICD-10-CM | POA: Insufficient documentation

## 2019-12-26 NOTE — Progress Notes (Signed)
   Covid-19 Vaccination Clinic  Name:  CHANNING YEAGER    MRN: [id number] DOB: 22-Mar-1946  12/26/2019  Mr. Eakins was observed post Covid-19 immunization for 15 minutes without incidence. He was provided with Vaccine Information Sheet and instruction to access the V-Safe system.   Mr. Burbach was instructed to call 911 with any severe reactions post vaccine: Marland Kitchen Difficulty breathing  . Swelling of your face and throat  . A fast heartbeat  . A bad rash all over your body  . Dizziness and weakness    Immunizations Administered    Name Date Dose VIS Date Route   Pfizer COVID-19 Vaccine 12/26/2019  5:02 PM 0.3 mL 10/24/2019 Intramuscular   Manufacturer: [employer], [employer]   Lot: [id number]   NDC: [address]-8

## 2020-05-10 DIAGNOSIS — H43821 Vitreomacular adhesion, right eye: Secondary | ICD-10-CM | POA: Diagnosis not present

## 2020-05-10 DIAGNOSIS — H35353 Cystoid macular degeneration, bilateral: Secondary | ICD-10-CM | POA: Diagnosis not present

## 2020-10-09 ENCOUNTER — Emergency Department (HOSPITAL_COMMUNITY): Payer: Medicare HMO

## 2020-10-09 ENCOUNTER — Other Ambulatory Visit: Payer: Self-pay

## 2020-10-09 ENCOUNTER — Emergency Department (HOSPITAL_COMMUNITY)
Admission: EM | Admit: 2020-10-09 | Discharge: 2020-10-09 | Disposition: A | Discharge status: Home / Self Care | Payer: Medicare HMO | Attending: Emergency Medicine | Admitting: Emergency Medicine

## 2020-10-09 DIAGNOSIS — J90 Pleural effusion, not elsewhere classified: Secondary | ICD-10-CM | POA: Diagnosis not present

## 2020-10-09 DIAGNOSIS — E039 Hypothyroidism, unspecified: Secondary | ICD-10-CM | POA: Diagnosis not present

## 2020-10-09 DIAGNOSIS — R079 Chest pain, unspecified: Secondary | ICD-10-CM | POA: Insufficient documentation

## 2020-10-09 DIAGNOSIS — N400 Enlarged prostate without lower urinary tract symptoms: Secondary | ICD-10-CM | POA: Diagnosis not present

## 2020-10-09 DIAGNOSIS — E119 Type 2 diabetes mellitus without complications: Secondary | ICD-10-CM | POA: Insufficient documentation

## 2020-10-09 DIAGNOSIS — I10 Essential (primary) hypertension: Secondary | ICD-10-CM | POA: Insufficient documentation

## 2020-10-09 DIAGNOSIS — Z79899 Other long term (current) drug therapy: Secondary | ICD-10-CM | POA: Insufficient documentation

## 2020-10-09 DIAGNOSIS — Z7984 Long term (current) use of oral hypoglycemic drugs: Secondary | ICD-10-CM | POA: Diagnosis not present

## 2020-10-09 DIAGNOSIS — I2510 Atherosclerotic heart disease of native coronary artery without angina pectoris: Secondary | ICD-10-CM | POA: Diagnosis not present

## 2020-10-09 DIAGNOSIS — N202 Calculus of kidney with calculus of ureter: Secondary | ICD-10-CM | POA: Diagnosis not present

## 2020-10-09 DIAGNOSIS — R911 Solitary pulmonary nodule: Secondary | ICD-10-CM | POA: Diagnosis not present

## 2020-10-09 DIAGNOSIS — R918 Other nonspecific abnormal finding of lung field: Secondary | ICD-10-CM | POA: Diagnosis not present

## 2020-10-09 DIAGNOSIS — I700 Atherosclerosis of aorta: Secondary | ICD-10-CM | POA: Diagnosis not present

## 2020-10-09 DIAGNOSIS — I701 Atherosclerosis of renal artery: Secondary | ICD-10-CM | POA: Diagnosis not present

## 2020-10-09 LAB — CBC
HCT: 47.9 % (ref 39.0–52.0)
Hemoglobin: 15.5 g/dL (ref 13.0–17.0)
MCH: 28.6 pg (ref 26.0–34.0)
MCHC: 32.4 g/dL (ref 30.0–36.0)
MCV: 88.4 fL (ref 80.0–100.0)
Platelets: 153 10*3/uL (ref 150–400)
RBC: 5.42 MIL/uL (ref 4.22–5.81)
RDW: 13.2 % (ref 11.5–15.5)
WBC: 4.2 10*3/uL (ref 4.0–10.5)
nRBC: 0 % (ref 0.0–0.2)

## 2020-10-09 LAB — BASIC METABOLIC PANEL
Anion gap: 9 (ref 5–15)
BUN: 21 mg/dL (ref 8–23)
CO2: 21 mmol/L — ABNORMAL LOW (ref 22–32)
Calcium: 9.8 mg/dL (ref 8.9–10.3)
Chloride: 109 mmol/L (ref 98–111)
Creatinine, Ser: 1.64 mg/dL — ABNORMAL HIGH (ref 0.61–1.24)
GFR, Estimated: 43 mL/min — ABNORMAL LOW (ref >60)
Glucose, Bld: 180 mg/dL — ABNORMAL HIGH (ref 70–99)
Potassium: 3.9 mmol/L (ref 3.5–5.1)
Sodium: 139 mmol/L (ref 135–145)

## 2020-10-09 LAB — TROPONIN I (HIGH SENSITIVITY)
Troponin I (High Sensitivity): 26 ng/L — ABNORMAL HIGH (ref <18)
Troponin I (High Sensitivity): 27 ng/L — ABNORMAL HIGH (ref <18)

## 2020-10-09 MED ORDER — SODIUM CHLORIDE 0.9 % IV BOLUS (SEPSIS)
1000.0000 mL | Freq: Once | INTRAVENOUS | Status: AC
  Administered 2020-10-09: 1000 mL via INTRAVENOUS

## 2020-10-09 MED ORDER — IOHEXOL 350 MG/ML SOLN
100.0000 mL | Freq: Once | INTRAVENOUS | Status: AC | PRN
  Administered 2020-10-09: 100 mL via INTRAVENOUS

## 2020-10-09 NOTE — ED Notes (Signed)
Pt transferred to CT.

## 2020-10-09 NOTE — ED Provider Notes (Signed)
[facility] EMERGENCY DEPARTMENT Provider Note   CSN: [id number] Arrival date & time: 10/09/20  0009     History Chief Complaint  Patient presents with  . Chest Pain    Victor Hutchinson is a 74 y.o. male.  74 year old male with a history of diabetes, hypertension, hypothyroid presents to the ED for evaluation of chest discomfort.  States that symptoms have been intermittent over the past few days.  Reports pain originates in his left groin and will migrate to his midsternal region.  Pain radiates to mid back at times between his shoulder blades.  Is not brought on by exertion or made worse with activity.  He will take Tylenol for pain when present with relief of symptoms within 30 minutes; recurs when Tylenol wears off after ~4 hours.  No associated diaphoresis, syncope/presyncope, SOB, hemoptysis, leg swelling, N/V, extremity weakness or paresthesias. Felt very flush today with symptoms and took his BP which was ~200 systolic. Decided to come to the ED for evaluation. Denies FHx of ACS/CAD. Is a non-smoker.  The history is provided by the patient. No language interpreter was used.  Chest Pain      Past Medical History:  Diagnosis Date  . Diabetes mellitus without complication (HCC)   . Gout 06/06/2013  . Hypertension   . Hypothyroidism   . Nephrolithiasis 06/06/2013  . Other and unspecified hyperlipidemia 06/06/2013    Patient Active Problem List   Diagnosis Date Noted  . Acute URI 03/01/2017  . Cough 03/01/2017  . Chest congestion 03/01/2017  . UTI (urinary tract infection) 06/06/2013  . Nephrolithiasis 06/06/2013  . Type II diabetes mellitus (HCC) 06/06/2013  . Essential hypertension, benign 06/06/2013  . Other and unspecified hyperlipidemia 06/06/2013  . Unspecified hypothyroidism 06/06/2013    No past surgical history on file.     Family History  Problem Relation Age of Onset  . Heart disease Sister     Social History   Tobacco Use  .  Smoking status: Never Smoker  . Smokeless tobacco: Never Used  Substance Use Topics  . Alcohol use: No  . Drug use: No    Home Medications Prior to Admission medications   Medication Sig Start Date End Date Taking? Authorizing Provider  amLODipine (NORVASC) 5 MG tablet Take 5 mg by mouth daily.    [provider]  benzonatate (TESSALON) 200 MG capsule Take 1 capsule (200 mg total) by mouth 2 (two) times daily as needed for cough. 03/01/17   Georgina Quint, MD  chlorpheniramine-phenylephrine-dextromethorphan [facility] DM) 3.5-1-3 MG/ML solution Take 1 mL by mouth every 6 (six) hours as needed for cough. Patient not taking: Reported on 03/01/2017 09/23/16   Ofilia Neas, PA-C  febuxostat (ULORIC) 40 MG tablet Take 40 mg by mouth daily.    [provider]  glipiZIDE (GLUCOTROL) 5 MG tablet Take 5 mg by mouth every morning.     [provider]  levothyroxine (SYNTHROID, LEVOTHROID) 25 MCG tablet Take 1 tablet (25 mcg total) by mouth daily. PATIENT NEEDS OFFICE VISIT FOR ADDITIONAL REFILLS    Godfrey Pick, PA-C  losartan (COZAAR) 100 MG tablet Take 100 mg by mouth daily.    [provider]  nystatin (MYCOSTATIN) 100000 UNIT/ML suspension Take 5 mLs (500,000 Units total) by mouth 4 (four) times daily. 03/10/17   Sharlene Dory, DO  promethazine-codeine (PHENERGAN WITH CODEINE) 6.25-10 MG/5ML syrup Take 5 mLs by mouth at bedtime as needed for cough. 03/01/17   Sagardia,  Eilleen Kempf, MD  tamsulosin (FLOMAX) 0.4 MG CAPS capsule Take 1 capsule (0.4 mg total) by mouth daily after supper. 07/12/13   Collene Gobble, MD    Allergies    Patient has no known allergies.  Review of Systems   Review of Systems  Cardiovascular: Positive for chest pain.  Ten systems reviewed and are negative for acute change, except as noted in the HPI.    Physical Exam Updated Vital Signs BP (!) 157/81   Pulse 75   Temp 97.9 F (36.6 C) (Oral)   Resp 20   Ht 5'  10" (1.778 m)   Wt 98 kg   SpO2 98%   BMI 30.99 kg/m   Physical Exam Vitals and nursing note reviewed.  Constitutional:      General: He is not in acute distress.    Appearance: He is well-developed. He is not diaphoretic.     Comments: Nontoxic-appearing and in no acute distress  HENT:     Head: Normocephalic and atraumatic.  Eyes:     General: No scleral icterus.    Conjunctiva/sclera: Conjunctivae normal.  Cardiovascular:     Rate and Rhythm: Normal rate and regular rhythm.     Pulses: Normal pulses.     Comments: No tenderness to palpation of the chest wall. Pulmonary:     Effort: Pulmonary effort is normal. No respiratory distress.     Breath sounds: No stridor. No wheezing or rales.     Comments: Lungs clear to auscultation bilaterally.  Respirations even and unlabored. Musculoskeletal:        General: Normal range of motion.     Cervical back: Normal range of motion.     Comments: No lower extremity edema  Skin:    General: Skin is warm and dry.     Coloration: Skin is not pale.     Findings: No erythema or rash.  Neurological:     Mental Status: He is alert and oriented to person, place, and time.     Coordination: Coordination normal.  Psychiatric:        Behavior: Behavior normal.     ED Results / Procedures / Treatments   Labs (all labs ordered are listed, but only abnormal results are displayed) Labs Reviewed  BASIC METABOLIC PANEL - Abnormal; Notable for the following components:      Result Value   CO2 21 (*)    Glucose, Bld 180 (*)    Creatinine, Ser 1.64 (*)    GFR, Estimated 43 (*)    All other components within normal limits  TROPONIN I (HIGH SENSITIVITY) - Abnormal; Notable for the following components:   Troponin I (High Sensitivity) 26 (*)    All other components within normal limits  TROPONIN I (HIGH SENSITIVITY) - Abnormal; Notable for the following components:   Troponin I (High Sensitivity) 27 (*)    All other components within normal  limits  CBC    EKG EKG Interpretation  Date/Time:  Saturday October 09 2020 01:26:17 EST Ventricular Rate:  83 PR Interval:  250 QRS Duration: 128 QT Interval:  396 QTC Calculation: 466 R Axis:   23 Text Interpretation: Sinus rhythm Prolonged PR interval Right bundle branch block Confirmed by Rochele Raring [phone]) on 10/09/2020 1:27:55 AM   Radiology DG Chest 2 View  Result Date: 10/09/2020 CLINICAL DATA:  Chest pain. EXAM: CHEST - 2 VIEW COMPARISON:  None. FINDINGS: The heart size is normal. There is elevation of the right hemidiaphragm. There is  a probable pulmonary nodule overlying the right upper lobe. There is no pneumothorax. No large pleural effusion. No focal infiltrate. The trachea is midline. There is no acute osseous abnormality. Aortic calcifications are noted IMPRESSION: 1. Probable pulmonary nodule overlying the right upper lobe. Follow-up with a nonemergent outpatient CT of the chest is recommended. 2. No acute cardiopulmonary process. Electronically Signed   By: Katherine Mantlehristopher  Green M.D.   On: 10/09/2020 00:50   CT Angio Chest/Abd/Pel for Dissection W and/or Wo Contrast  Result Date: 10/09/2020 CLINICAL DATA:  Aortic dissection suspected EXAM: CT ANGIOGRAPHY CHEST, ABDOMEN AND PELVIS TECHNIQUE: Non-contrast CT of the chest was initially obtained. Multidetector CT imaging through the chest, abdomen and pelvis was performed using the standard protocol during bolus administration of intravenous contrast. Multiplanar reconstructed images and MIPs were obtained and reviewed to evaluate the vascular anatomy. CONTRAST:  100mL OMNIPAQUE IOHEXOL 350 MG/ML SOLN COMPARISON:  None. FINDINGS: CTA CHEST FINDINGS Cardiovascular: There is no evidence for thoracic aortic dissection or aneurysm. The heart size is normal. Coronary artery calcifications are noted. There are atherosclerotic changes of the thoracic aorta. There is no large centrally located pulmonary embolism. The arch vessels are  patent where visualized. Mediastinum/Nodes: -- No mediastinal lymphadenopathy. -- No hilar lymphadenopathy. -- No axillary lymphadenopathy. -- No supraclavicular lymphadenopathy. -- Normal thyroid gland where visualized. -  Unremarkable esophagus. Lungs/Pleura: There is scattered bilateral calcified pulmonary nodules there is no pneumothorax or pleural effusion. Musculoskeletal: No chest wall abnormality. No bony spinal canal stenosis. Review of the MIP images confirms the above findings. CTA ABDOMEN AND PELVIS FINDINGS VASCULAR Aorta: There are atherosclerotic changes of the abdominal aorta without evidence for an aneurysm. Celiac: Patent without evidence of aneurysm, dissection, vasculitis or significant stenosis. SMA: Patent without evidence of aneurysm, dissection, vasculitis or significant stenosis. Renals: Both renal arteries are patent without evidence of aneurysm, dissection, vasculitis, fibromuscular dysplasia or significant stenosis. IMA: There is moderate narrowing of both renal arteries. Inflow: Patent without evidence of aneurysm, dissection, vasculitis or significant stenosis. Veins: There is a duplicated IVC, a normal variant. Review of the MIP images confirms the above findings. NON-VASCULAR Hepatobiliary: The liver is normal. Cholelithiasis without acute inflammation.There is no biliary ductal dilation. Pancreas: Normal contours without ductal dilatation. No peripancreatic fluid collection. Spleen: The spleen is borderline enlarged measuring 13 cm craniocaudad. Adrenals/Urinary Tract: --Adrenal glands: Unremarkable. --Right kidney/ureter: No hydronephrosis or radiopaque kidney stones. --Left kidney/ureter: Nonobstructing left-sided kidney stones are noted measuring to approximately 1.2 cm. --Urinary bladder: Unremarkable. Stomach/Bowel: --Stomach/Duodenum: No hiatal hernia or other gastric abnormality. Normal duodenal course and caliber. --Small bowel: Unremarkable. --Colon: Rectosigmoid  diverticulosis without acute inflammation. --Appendix: Normal. Lymphatic: --No retroperitoneal lymphadenopathy. --No mesenteric lymphadenopathy. --No pelvic or inguinal lymphadenopathy. Reproductive: The prostate gland is enlarged. Other: No ascites or free air. The abdominal wall is normal. Musculoskeletal. No acute displaced fractures. Review of the MIP images confirms the above findings. IMPRESSION: 1. No evidence for aortic dissection or aneurysm. 2. Cholelithiasis without acute inflammation. 3. Nonobstructing left-sided kidney stones. 4. Rectosigmoid diverticulosis without acute inflammation. Aortic Atherosclerosis (ICD10-I70.0). Electronically Signed   By: Katherine Mantlehristopher  Green M.D.   On: 10/09/2020 03:37    Procedures Procedures (including critical care time)  Medications Ordered in ED Medications  sodium chloride 0.9 % bolus 1,000 mL (0 mLs Intravenous Stopped 10/09/20 0402)  iohexol (OMNIPAQUE) 350 MG/ML injection 100 mL (100 mLs Intravenous Contrast Given 10/09/20 0324)    ED Course  I have reviewed the triage vital signs and the nursing notes.  Pertinent labs & imaging results that were available during my care of the patient were reviewed by me and considered in my medical decision making (see chart for details).    MDM Rules/Calculators/A&P                          74 year old male presenting to the ED for evaluation of chest pain.  Pain originates in the left groin and radiates to the left chest, occasionally through to the back.  It has been intermittent, but relieved with Tylenol.  Is not aggravated with exertion or associated with diaphoresis, shortness of breath, lightheadedness.  Patient calm, well-appearing.  Physical exam reassuring.  No lower extremity edema.  Lungs clear to auscultation bilaterally.  His evaluation was initiated in triage.  Notable for troponin elevation to 26.  This is suspected to be secondary to patient's underlying history of CKD.  EKG without signs of  acute ischemia.  His creatinine is stable compared to 2014.  Given hypertension in the ED as well as chest pain radiating to the back, decision was made to proceed with CTA to rule out dissection.  Imaging today is without evidence of dissection or aneurysm.  Also no evidence of pneumonia, pneumothorax, pleural effusion.  Delta troponin remains stable.  Patient encouraged to follow-up with his primary care doctor for further evaluation of his symptoms.  He receives his care through the [facility] hospital.  Patient told to continue his daily prescribed medications.  Return precautions discussed and provided. Patient discharged in stable condition with no unaddressed concerns.   Final Clinical Impression(s) / ED Diagnoses Final diagnoses:  Chest pain, unspecified type  Hypertension not at goal    Rx / DC Orders ED Discharge Orders    None       Antony Madura, PA-C 10/09/20 0513    Ward, Layla Maw, DO 10/09/20 [phone]

## 2020-10-09 NOTE — ED Notes (Signed)
Patient verbalizes understanding of discharge instructions. Opportunity for questioning and answers were provided. Armband removed by staff, pt discharged from ED ambulatory with family.  

## 2020-10-09 NOTE — ED Triage Notes (Signed)
Pt said he has been having chest discomfort for a few days that ws in the center of chest and wet through to his back and in between his shoulder blades. Pt said he can take tylenol nd pain will go away. Pt said he is burping a lot. Pt said he came tonight bc his face got very red and hot and pressures got higher.

## 2020-10-09 NOTE — Discharge Instructions (Signed)
Your evaluation in the ED today was reassuring.  We recommend that you have your blood pressure rechecked by your primary care doctor within the week.  Continue your daily prescribed medications.  Return to the ED for new or concerning symptoms.

## 2020-10-09 NOTE — ED Notes (Signed)
Pt returned from CT °

## 2020-11-08 DIAGNOSIS — H43821 Vitreomacular adhesion, right eye: Secondary | ICD-10-CM | POA: Diagnosis not present

## 2020-11-08 DIAGNOSIS — Z961 Presence of intraocular lens: Secondary | ICD-10-CM | POA: Diagnosis not present

## 2020-11-08 DIAGNOSIS — E119 Type 2 diabetes mellitus without complications: Secondary | ICD-10-CM | POA: Diagnosis not present

## 2020-12-24 ENCOUNTER — Encounter (HOSPITAL_COMMUNITY): Payer: Self-pay | Admitting: Emergency Medicine

## 2020-12-24 ENCOUNTER — Other Ambulatory Visit: Payer: Self-pay

## 2020-12-24 ENCOUNTER — Inpatient Hospital Stay (HOSPITAL_COMMUNITY)
Admission: EM | Disposition: A | Discharge status: Home / Self Care | Payer: Self-pay | Source: Home / Self Care | Attending: Cardiology

## 2020-12-24 ENCOUNTER — Inpatient Hospital Stay (HOSPITAL_COMMUNITY)
Admission: EM | Admit: 2020-12-24 | Discharge: 2020-12-26 | DRG: 247 | Disposition: A | Discharge status: Home / Self Care | Payer: No Typology Code available for payment source | Attending: Cardiology | Admitting: Cardiology

## 2020-12-24 ENCOUNTER — Emergency Department (HOSPITAL_COMMUNITY): Payer: No Typology Code available for payment source

## 2020-12-24 DIAGNOSIS — I119 Hypertensive heart disease without heart failure: Secondary | ICD-10-CM | POA: Diagnosis present

## 2020-12-24 DIAGNOSIS — I2102 ST elevation (STEMI) myocardial infarction involving left anterior descending coronary artery: Secondary | ICD-10-CM | POA: Diagnosis not present

## 2020-12-24 DIAGNOSIS — E785 Hyperlipidemia, unspecified: Secondary | ICD-10-CM | POA: Diagnosis not present

## 2020-12-24 DIAGNOSIS — I129 Hypertensive chronic kidney disease with stage 1 through stage 4 chronic kidney disease, or unspecified chronic kidney disease: Secondary | ICD-10-CM | POA: Diagnosis present

## 2020-12-24 DIAGNOSIS — Z79890 Hormone replacement therapy (postmenopausal): Secondary | ICD-10-CM

## 2020-12-24 DIAGNOSIS — E039 Hypothyroidism, unspecified: Secondary | ICD-10-CM | POA: Diagnosis present

## 2020-12-24 DIAGNOSIS — I2510 Atherosclerotic heart disease of native coronary artery without angina pectoris: Secondary | ICD-10-CM | POA: Diagnosis not present

## 2020-12-24 DIAGNOSIS — Z7984 Long term (current) use of oral hypoglycemic drugs: Secondary | ICD-10-CM

## 2020-12-24 DIAGNOSIS — Z79899 Other long term (current) drug therapy: Secondary | ICD-10-CM

## 2020-12-24 DIAGNOSIS — M109 Gout, unspecified: Secondary | ICD-10-CM | POA: Diagnosis present

## 2020-12-24 DIAGNOSIS — N400 Enlarged prostate without lower urinary tract symptoms: Secondary | ICD-10-CM | POA: Diagnosis present

## 2020-12-24 DIAGNOSIS — Z20822 Contact with and (suspected) exposure to covid-19: Secondary | ICD-10-CM | POA: Diagnosis present

## 2020-12-24 DIAGNOSIS — E1122 Type 2 diabetes mellitus with diabetic chronic kidney disease: Secondary | ICD-10-CM | POA: Diagnosis present

## 2020-12-24 DIAGNOSIS — E119 Type 2 diabetes mellitus without complications: Secondary | ICD-10-CM

## 2020-12-24 DIAGNOSIS — I214 Non-ST elevation (NSTEMI) myocardial infarction: Principal | ICD-10-CM

## 2020-12-24 DIAGNOSIS — Z8249 Family history of ischemic heart disease and other diseases of the circulatory system: Secondary | ICD-10-CM

## 2020-12-24 DIAGNOSIS — I10 Essential (primary) hypertension: Secondary | ICD-10-CM | POA: Diagnosis present

## 2020-12-24 DIAGNOSIS — N1832 Chronic kidney disease, stage 3b: Secondary | ICD-10-CM | POA: Diagnosis present

## 2020-12-24 DIAGNOSIS — Z955 Presence of coronary angioplasty implant and graft: Secondary | ICD-10-CM

## 2020-12-24 DIAGNOSIS — I249 Acute ischemic heart disease, unspecified: Secondary | ICD-10-CM | POA: Diagnosis not present

## 2020-12-24 HISTORY — PX: LEFT HEART CATH AND CORONARY ANGIOGRAPHY: CATH118249

## 2020-12-24 HISTORY — PX: CORONARY/GRAFT ACUTE MI REVASCULARIZATION: CATH118305

## 2020-12-24 HISTORY — DX: Non-ST elevation (NSTEMI) myocardial infarction: I21.4

## 2020-12-24 LAB — CBC
HCT: 47 % (ref 39.0–52.0)
Hemoglobin: 15.8 g/dL (ref 13.0–17.0)
MCH: 29.2 pg (ref 26.0–34.0)
MCHC: 33.6 g/dL (ref 30.0–36.0)
MCV: 86.7 fL (ref 80.0–100.0)
Platelets: 153 10*3/uL (ref 150–400)
RBC: 5.42 MIL/uL (ref 4.22–5.81)
RDW: 13.5 % (ref 11.5–15.5)
WBC: 4.8 10*3/uL (ref 4.0–10.5)
nRBC: 0 % (ref 0.0–0.2)

## 2020-12-24 LAB — TROPONIN I (HIGH SENSITIVITY)
Troponin I (High Sensitivity): 1386 ng/L (ref <18)
Troponin I (High Sensitivity): 1501 ng/L (ref <18)
Troponin I (High Sensitivity): 2502 ng/L (ref <18)
Troponin I (High Sensitivity): 2414 ng/L (ref <18)

## 2020-12-24 LAB — BASIC METABOLIC PANEL
Anion gap: 12 (ref 5–15)
BUN: 30 mg/dL — ABNORMAL HIGH (ref 8–23)
CO2: 19 mmol/L — ABNORMAL LOW (ref 22–32)
Calcium: 9.8 mg/dL (ref 8.9–10.3)
Chloride: 109 mmol/L (ref 98–111)
Creatinine, Ser: 1.83 mg/dL — ABNORMAL HIGH (ref 0.61–1.24)
GFR, Estimated: 38 mL/min — ABNORMAL LOW (ref >60)
Glucose, Bld: 163 mg/dL — ABNORMAL HIGH (ref 70–99)
Potassium: 3.7 mmol/L (ref 3.5–5.1)
Sodium: 140 mmol/L (ref 135–145)

## 2020-12-24 LAB — RESP PANEL BY RT-PCR (FLU A&B, COVID) ARPGX2
Influenza A by PCR: NEGATIVE
Influenza B by PCR: NEGATIVE
SARS Coronavirus 2 by RT PCR: NEGATIVE

## 2020-12-24 LAB — GLUCOSE, CAPILLARY
Glucose-Capillary: 121 mg/dL — ABNORMAL HIGH (ref 70–99)
Glucose-Capillary: 110 mg/dL — ABNORMAL HIGH (ref 70–99)

## 2020-12-24 LAB — HEMOGLOBIN A1C
Hgb A1c MFr Bld: 6.9 % — ABNORMAL HIGH (ref 4.8–5.6)
Mean Plasma Glucose: 151.33 mg/dL

## 2020-12-24 LAB — PROTIME-INR
INR: 1 (ref 0.8–1.2)
Prothrombin Time: 13.1 seconds (ref 11.4–15.2)

## 2020-12-24 LAB — LIPID PANEL
Cholesterol: 250 mg/dL — ABNORMAL HIGH (ref 0–200)
HDL: 30 mg/dL — ABNORMAL LOW (ref >40)
LDL Cholesterol: UNDETERMINED mg/dL (ref 0–99)
Total CHOL/HDL Ratio: 8.3 RATIO
Triglycerides: 451 mg/dL — ABNORMAL HIGH (ref <150)
VLDL: UNDETERMINED mg/dL (ref 0–40)

## 2020-12-24 LAB — APTT: aPTT: 29 seconds (ref 24–36)

## 2020-12-24 LAB — TSH: TSH: 0.919 u[IU]/mL (ref 0.350–4.500)

## 2020-12-24 LAB — LDL CHOLESTEROL, DIRECT: Direct LDL: 142.4 mg/dL — ABNORMAL HIGH (ref 0–99)

## 2020-12-24 LAB — POCT ACTIVATED CLOTTING TIME
Activated Clotting Time: 618 seconds
Activated Clotting Time: 196 seconds
Activated Clotting Time: 160 seconds

## 2020-12-24 SURGERY — LEFT HEART CATH AND CORONARY ANGIOGRAPHY
Anesthesia: LOCAL

## 2020-12-24 MED ORDER — ROSUVASTATIN CALCIUM 5 MG PO TABS
10.0000 mg | ORAL_TABLET | Freq: Every day | ORAL | Status: DC
  Administered 2020-12-24: 10 mg via ORAL
  Filled 2020-12-24: qty 2

## 2020-12-24 MED ORDER — BIVALIRUDIN TRIFLUOROACETATE 250 MG IV SOLR
INTRAVENOUS | Status: AC
  Filled 2020-12-24: qty 250

## 2020-12-24 MED ORDER — SODIUM CHLORIDE 0.9 % IV SOLN: INTRAVENOUS | Status: AC

## 2020-12-24 MED ORDER — MIDAZOLAM HCL 2 MG/2ML IJ SOLN
INTRAMUSCULAR | Status: AC
  Filled 2020-12-24: qty 2

## 2020-12-24 MED ORDER — INSULIN ASPART 100 UNIT/ML ~~LOC~~ SOLN
0.0000 [IU] | Freq: Three times a day (TID) | SUBCUTANEOUS | Status: DC
  Administered 2020-12-25: 3 [IU] via SUBCUTANEOUS
  Administered 2020-12-26: 2 [IU] via SUBCUTANEOUS

## 2020-12-24 MED ORDER — BIVALIRUDIN BOLUS VIA INFUSION - CUPID
INTRAVENOUS | Status: DC | PRN
  Administered 2020-12-24: 73.5 mg via INTRAVENOUS

## 2020-12-24 MED ORDER — TICAGRELOR 90 MG PO TABS
ORAL_TABLET | ORAL | Status: DC | PRN
  Administered 2020-12-24: 180 mg via ORAL

## 2020-12-24 MED ORDER — ONDANSETRON HCL 4 MG/2ML IJ SOLN: 4.0000 mg | Freq: Four times a day (QID) | INTRAMUSCULAR | Status: DC | PRN

## 2020-12-24 MED ORDER — SODIUM CHLORIDE 0.9 % IV SOLN: INTRAVENOUS | Status: DC

## 2020-12-24 MED ORDER — ASPIRIN 81 MG PO CHEW
81.0000 mg | CHEWABLE_TABLET | Freq: Every day | ORAL | Status: DC
  Administered 2020-12-25 – 2020-12-26 (×2): 81 mg via ORAL
  Filled 2020-12-24 (×2): qty 1

## 2020-12-24 MED ORDER — HEPARIN (PORCINE) IN NACL 1000-0.9 UT/500ML-% IV SOLN
INTRAVENOUS | Status: DC | PRN
  Administered 2020-12-24 (×3): 500 mL

## 2020-12-24 MED ORDER — FEBUXOSTAT 40 MG PO TABS
40.0000 mg | ORAL_TABLET | Freq: Every day | ORAL | Status: DC
  Administered 2020-12-25 – 2020-12-26 (×2): 40 mg via ORAL
  Filled 2020-12-24 (×2): qty 1

## 2020-12-24 MED ORDER — NITROGLYCERIN 1 MG/10 ML FOR IR/CATH LAB
INTRA_ARTERIAL | Status: AC
  Filled 2020-12-24: qty 10

## 2020-12-24 MED ORDER — NITROGLYCERIN 0.4 MG SL SUBL: 0.4000 mg | SUBLINGUAL_TABLET | SUBLINGUAL | Status: DC | PRN

## 2020-12-24 MED ORDER — MIDAZOLAM HCL 2 MG/2ML IJ SOLN
INTRAMUSCULAR | Status: DC | PRN
  Administered 2020-12-24 (×2): 1 mg via INTRAVENOUS
  Administered 2020-12-24: 2 mg via INTRAVENOUS

## 2020-12-24 MED ORDER — VERAPAMIL HCL 2.5 MG/ML IV SOLN: INTRAVENOUS | Status: DC | PRN

## 2020-12-24 MED ORDER — SODIUM CHLORIDE 0.9% FLUSH
3.0000 mL | INTRAVENOUS | Status: DC | PRN
  Administered 2020-12-26: 3 mL via INTRAVENOUS

## 2020-12-24 MED ORDER — NITROGLYCERIN 1 MG/10 ML FOR IR/CATH LAB
INTRA_ARTERIAL | Status: DC | PRN
  Administered 2020-12-24 (×3): 200 ug

## 2020-12-24 MED ORDER — HEPARIN SODIUM (PORCINE) 5000 UNIT/ML IJ SOLN
4000.0000 [IU] | Freq: Once | INTRAMUSCULAR | Status: AC
  Administered 2020-12-24: 4000 [IU] via INTRAVENOUS
  Filled 2020-12-24: qty 1

## 2020-12-24 MED ORDER — ASPIRIN 81 MG PO CHEW
324.0000 mg | CHEWABLE_TABLET | Freq: Once | ORAL | Status: AC
  Administered 2020-12-24: 324 mg via ORAL
  Filled 2020-12-24: qty 4

## 2020-12-24 MED ORDER — SODIUM CHLORIDE 0.9 % IV SOLN: 250.0000 mL | INTRAVENOUS | Status: DC | PRN

## 2020-12-24 MED ORDER — DIAZEPAM 5 MG PO TABS: 5.0000 mg | ORAL_TABLET | ORAL | Status: DC | PRN

## 2020-12-24 MED ORDER — HEPARIN (PORCINE) IN NACL 1000-0.9 UT/500ML-% IV SOLN
INTRAVENOUS | Status: AC
  Filled 2020-12-24: qty 1500

## 2020-12-24 MED ORDER — IOHEXOL 350 MG/ML SOLN
INTRAVENOUS | Status: DC | PRN
  Administered 2020-12-24: 150 mL

## 2020-12-24 MED ORDER — HEPARIN (PORCINE) 25000 UT/250ML-% IV SOLN
1200.0000 [IU]/h | INTRAVENOUS | Status: DC
  Filled 2020-12-24: qty 250

## 2020-12-24 MED ORDER — HEPARIN SODIUM (PORCINE) 5000 UNIT/ML IJ SOLN
5000.0000 [IU] | Freq: Three times a day (TID) | INTRAMUSCULAR | Status: DC
  Administered 2020-12-25 – 2020-12-26 (×3): 5000 [IU] via SUBCUTANEOUS
  Filled 2020-12-24 (×4): qty 1

## 2020-12-24 MED ORDER — TICAGRELOR 90 MG PO TABS
ORAL_TABLET | ORAL | Status: AC
  Filled 2020-12-24: qty 1

## 2020-12-24 MED ORDER — SODIUM CHLORIDE 0.9% FLUSH: 3.0000 mL | INTRAVENOUS | Status: DC | PRN

## 2020-12-24 MED ORDER — HYDRALAZINE HCL 20 MG/ML IJ SOLN
10.0000 mg | INTRAMUSCULAR | Status: AC | PRN
Start: 2020-12-24 — End: 2020-12-25

## 2020-12-24 MED ORDER — TICAGRELOR 90 MG PO TABS
90.0000 mg | ORAL_TABLET | Freq: Two times a day (BID) | ORAL | Status: DC
  Administered 2020-12-24 – 2020-12-26 (×4): 90 mg via ORAL
  Filled 2020-12-24 (×4): qty 1

## 2020-12-24 MED ORDER — IOHEXOL 350 MG/ML SOLN
INTRAVENOUS | Status: AC
  Filled 2020-12-24: qty 1

## 2020-12-24 MED ORDER — SODIUM CHLORIDE 0.9 % WEIGHT BASED INFUSION
3.0000 mL/kg/h | INTRAVENOUS | Status: AC
Start: 2020-12-24 — End: 2020-12-24
  Administered 2020-12-24: 3 mL/kg/h via INTRAVENOUS

## 2020-12-24 MED ORDER — SODIUM CHLORIDE 0.9% FLUSH
3.0000 mL | Freq: Two times a day (BID) | INTRAVENOUS | Status: DC
  Administered 2020-12-24: 3 mL via INTRAVENOUS

## 2020-12-24 MED ORDER — HEPARIN SODIUM (PORCINE) 1000 UNIT/ML IJ SOLN
INTRAMUSCULAR | Status: AC
  Filled 2020-12-24: qty 1

## 2020-12-24 MED ORDER — SODIUM CHLORIDE 0.9% FLUSH
3.0000 mL | Freq: Two times a day (BID) | INTRAVENOUS | Status: DC
  Administered 2020-12-24 – 2020-12-25 (×3): 3 mL via INTRAVENOUS

## 2020-12-24 MED ORDER — INSULIN ASPART 100 UNIT/ML ~~LOC~~ SOLN: 0.0000 [IU] | Freq: Every day | SUBCUTANEOUS | Status: DC

## 2020-12-24 MED ORDER — LIDOCAINE HCL (PF) 1 % IJ SOLN
INTRAMUSCULAR | Status: DC | PRN
  Administered 2020-12-24 (×2): 2 mL
  Administered 2020-12-24: 5 mL

## 2020-12-24 MED ORDER — FENTANYL CITRATE (PF) 100 MCG/2ML IJ SOLN
INTRAMUSCULAR | Status: AC
  Filled 2020-12-24: qty 2

## 2020-12-24 MED ORDER — SODIUM CHLORIDE 0.9 % WEIGHT BASED INFUSION
1.0000 mL/kg/h | INTRAVENOUS | Status: DC
Start: 2020-12-24 — End: 2020-12-26

## 2020-12-24 MED ORDER — LABETALOL HCL 5 MG/ML IV SOLN: 10.0000 mg | INTRAVENOUS | Status: AC | PRN

## 2020-12-24 MED ORDER — ACETAMINOPHEN 325 MG PO TABS: 650.0000 mg | ORAL_TABLET | ORAL | Status: DC | PRN

## 2020-12-24 MED ORDER — ALPRAZOLAM 0.25 MG PO TABS: 0.2500 mg | ORAL_TABLET | Freq: Two times a day (BID) | ORAL | Status: DC | PRN

## 2020-12-24 MED ORDER — LEVOTHYROXINE SODIUM 25 MCG PO TABS
25.0000 ug | ORAL_TABLET | Freq: Every day | ORAL | Status: DC
  Administered 2020-12-25 – 2020-12-26 (×2): 25 ug via ORAL
  Filled 2020-12-24 (×2): qty 1

## 2020-12-24 MED ORDER — VERAPAMIL HCL 2.5 MG/ML IV SOLN
INTRAVENOUS | Status: AC
  Filled 2020-12-24: qty 2

## 2020-12-24 MED ORDER — LIDOCAINE HCL (PF) 1 % IJ SOLN
INTRAMUSCULAR | Status: AC
  Filled 2020-12-24: qty 30

## 2020-12-24 MED ORDER — ZOLPIDEM TARTRATE 5 MG PO TABS: 5.0000 mg | ORAL_TABLET | Freq: Every evening | ORAL | Status: DC | PRN

## 2020-12-24 MED ORDER — FENTANYL CITRATE (PF) 100 MCG/2ML IJ SOLN
INTRAMUSCULAR | Status: DC | PRN
  Administered 2020-12-24 (×3): 25 ug via INTRAVENOUS

## 2020-12-24 MED ORDER — SODIUM CHLORIDE 0.9 % IV SOLN
INTRAVENOUS | Status: AC | PRN
  Administered 2020-12-24: 1.75 mg/kg/h via INTRAVENOUS
  Administered 2020-12-24: 1.75 mg/kg/h

## 2020-12-24 MED ORDER — TAMSULOSIN HCL 0.4 MG PO CAPS
0.4000 mg | ORAL_CAPSULE | Freq: Every day | ORAL | Status: DC
  Administered 2020-12-24 – 2020-12-25 (×2): 0.4 mg via ORAL
  Filled 2020-12-24 (×2): qty 1

## 2020-12-24 SURGICAL SUPPLY — 23 items
BAG SNAP BAND KOVER 36X36 (MISCELLANEOUS) ×2 IMPLANT
BALLN SAPPHIRE 2.0X12 (BALLOONS) ×2
BALLN SAPPHIRE ~~LOC~~ 2.5X12 (BALLOONS) ×2 IMPLANT
BALLOON SAPPHIRE 2.0X12 (BALLOONS) ×1 IMPLANT
CATH INFINITI 5FR MULTPACK ANG (CATHETERS) ×2 IMPLANT
CATH OPTITORQUE TIG 4.0 5F (CATHETERS) IMPLANT
CATH VISTA GUIDE 6FR XB3.5 (CATHETERS) ×2 IMPLANT
COVER DOME SNAP 22 D (MISCELLANEOUS) ×2 IMPLANT
GLIDESHEATH SLEND SS 6F .021 (SHEATH) IMPLANT
GUIDEWIRE INQWIRE 1.5J.035X260 (WIRE) IMPLANT
INQWIRE 1.5J .035X260CM (WIRE)
KIT ENCORE 26 ADVANTAGE (KITS) ×2 IMPLANT
KIT HEART LEFT (KITS) ×2 IMPLANT
PACK CARDIAC CATHETERIZATION (CUSTOM PROCEDURE TRAY) ×2 IMPLANT
SHEATH PINNACLE 5F 10CM (SHEATH) ×2 IMPLANT
SHEATH PINNACLE 6F 10CM (SHEATH) ×2 IMPLANT
SHEATH PROBE COVER 6X72 (BAG) ×2 IMPLANT
STENT RESOLUTE ONYX 2.5X18 (Permanent Stent) ×2 IMPLANT
TRANSDUCER W/STOPCOCK (MISCELLANEOUS) ×2 IMPLANT
TUBING CIL FLEX 10 FLL-RA (TUBING) ×2 IMPLANT
WIRE COUGAR XT STRL 190CM (WIRE) ×2 IMPLANT
WIRE EMERALD 3MM-J .035X150CM (WIRE) ×2 IMPLANT
WIRE TORQFLEX AUST .018X40CM (WIRE) ×2 IMPLANT

## 2020-12-24 NOTE — H&P (Signed)
Cardiology Admission History and Physical:   Patient ID: Victor Hutchinson MRN: [id number]; DOB: 1946-02-28   Admission date: 12/24/2020  PCP:  [facility]  Cardiologist:  Donato Schultz, MD New scans   Chief Complaint: Severe chest pain Patient Profile:   Victor Hutchinson is a 75 y.o. male with DM II, HTN, HLD, FH CAD, gout, hypothyroid, CKD IIIb creatinine 1.5-1.8.   He had CP and came to the ER, dynamic ECG changes noted with greater than 1 mm of ST elevation in V3 through V6 originally, now resolved with deep T wave inversion and trop elevated.  History of Present Illness:   Victor Hutchinson is a 75 year old male with diabetes hypertension hyperlipidemia with prior statin intolerance who presents today to the emergency department with severe crushing substernal chest discomfort starting at about 830 this morning after he woke up.  Gradually started then increased to 10 out of 10 severity.  Felt some radiation across his chest as well into his arms.  No significant shortness of breath or diaphoresis.  After this did not subside, he decided to drive himself to the hospital for further evaluation.  His wife Elnita Maxwell is an Hotel manager.    His symptoms first started on Wednesday morning at 3:30 AM where he woke up and felt severe crushing chest discomfort that gradually resolved on its own.  He took his blood pressure at the time and it was highly elevated at 240/160 with his heart rate elevated as well at 130.  He took some deep breaths calm down and gradually resolved.  He did not have another episode until earlier this morning.  His sister had heart disease and died after surgery he states.  He has a history of having "bad kidneys ".  Also has a history of diabetes on 2 oral medications.  He is not on Metformin.  Has no hx CAD.   His original EKG when presenting to the emergency department at 10:39 AM demonstrates sinus rhythm with right bundle  branch block and approximately 1 mm of ST segment elevation in V3 through V6.  On repeat EKG at 1252, segments had resolved but T wave inversions are now present.  Currently he is fairly comfortable in bed, no significant chest discomfort at this time.     Past Medical History:  Diagnosis Date  . Diabetes mellitus without complication (HCC)   . Gout 06/06/2013  . Hypertension   . Hypothyroidism   . Nephrolithiasis 06/06/2013  . Other and unspecified hyperlipidemia 06/06/2013    History reviewed. No pertinent surgical history.   Medications Prior to Admission: Prior to Admission medications   Medication Sig Start Date End Date Taking? Authorizing Provider  amLODipine (NORVASC) 5 MG tablet Take 5 mg by mouth daily.    [provider]  benzonatate (TESSALON) 200 MG capsule Take 1 capsule (200 mg total) by mouth 2 (two) times daily as needed for cough. 03/01/17   Georgina Quint, MD  chlorpheniramine-phenylephrine-dextromethorphan [facility] DM) 3.5-1-3 MG/ML solution Take 1 mL by mouth every 6 (six) hours as needed for cough. Patient not taking: Reported on 03/01/2017 09/23/16   Ofilia Neas, PA-C  febuxostat (ULORIC) 40 MG tablet Take 40 mg by mouth daily.    [provider]  glipiZIDE (GLUCOTROL) 5 MG tablet Take 5 mg by mouth every morning.     [provider]  levothyroxine (SYNTHROID, LEVOTHROID) 25 MCG tablet Take 1 tablet (25 mcg total)  by mouth daily. PATIENT NEEDS OFFICE VISIT FOR ADDITIONAL REFILLS    Godfrey Pick, PA-C  losartan (COZAAR) 100 MG tablet Take 100 mg by mouth daily.    [provider]  nystatin (MYCOSTATIN) 100000 UNIT/ML suspension Take 5 mLs (500,000 Units total) by mouth 4 (four) times daily. 03/10/17   Sharlene Dory, DO  promethazine-codeine (PHENERGAN WITH CODEINE) 6.25-10 MG/5ML syrup Take 5 mLs by mouth at bedtime as needed for cough. 03/01/17   Georgina Quint, MD  tamsulosin (FLOMAX) 0.4 MG CAPS  capsule Take 1 capsule (0.4 mg total) by mouth daily after supper. 07/12/13   Collene Gobble, MD     Allergies:    Allergies  Allergen Reactions  . Statins     Had muscle aches in the past    Social History:   Social History   Socioeconomic History  . Marital status: Married    Spouse name: Not on file  . Number of children: Not on file  . Years of education: Not on file  . Highest education level: Not on file  Occupational History  . Not on file  Tobacco Use  . Smoking status: Never Smoker  . Smokeless tobacco: Never Used  Substance and Sexual Activity  . Alcohol use: No  . Drug use: No  . Sexual activity: Yes  Other Topics Concern  . Not on file  Social History Narrative  . Not on file   Social Determinants of Health   Financial Resource Strain: Not on file  Food Insecurity: Not on file  Transportation Needs: Not on file  Physical Activity: Not on file  Stress: Not on file  Social Connections: Not on file  Intimate Partner Violence: Not on file    Family History:   The patient's family history includes Heart disease in his sister.    ROS:  Please see the history of present illness.  Denies any fevers chills nausea vomiting syncope bleeding all other ROS reviewed and negative.     Physical Exam/Data:   Vitals:   12/24/20 1205 12/24/20 1249 12/24/20 1300 12/24/20 1315  BP: (!) 146/88 (!) 150/101 (!) 157/90 (!) 157/86  Pulse: 78 79 79 83  Resp: 16 19 (!) 22 19  Temp: 98.3 F (36.8 C)     TempSrc: Oral     SpO2: 98% 96% 98% 100%  Weight: 98 kg     Height: 5\' 10"  (1.778 m)      No intake or output data in the 24 hours ending 12/24/20 1351 Last 3 Weights 12/24/2020 10/09/2020 03/01/2017  Weight (lbs) 216 lb 216 lb 220 lb 9.6 oz  Weight (kg) 97.977 kg 97.977 kg 100.064 kg     Body mass index is 30.99 kg/m.  General:  Well nourished, well developed, in no acute distress HEENT: normal Lymph: no adenopathy Neck: no JVD Endocrine:  No  thryomegaly Vascular: No carotid bruits; FA pulses 2+ bilaterally without bruits  Cardiac:  normal S1, S2; RRR; no murmur  Lungs:  clear to auscultation bilaterally, no wheezing, rhonchi or rales  Abd: soft, nontender, no hepatomegaly  Ext: no edema Musculoskeletal:  No deformities, BUE and BLE strength normal and equal Skin: warm and dry  Neuro:  CNs 2-12 intact, no focal abnormalities noted Psych:  Normal affect    EKG: As described above in HPI  Relevant CV Studies:  CATH: SCHEDULED  Laboratory Data:  High Sensitivity Troponin:   Recent Labs  Lab 12/24/20 1045  TROPONINIHS 1,386*  Chemistry Recent Labs  Lab 12/24/20 1045  NA 140  K 3.7  CL 109  CO2 19*  GLUCOSE 163*  BUN 30*  CREATININE 1.83*  CALCIUM 9.8  GFRNONAA 38*  ANIONGAP 12    No results for input(s): PROT, ALBUMIN, AST, ALT, ALKPHOS, BILITOT in the last 168 hours. Hematology Recent Labs  Lab 12/24/20 1045  WBC 4.8  RBC 5.42  HGB 15.8  HCT 47.0  MCV 86.7  MCH 29.2  MCHC 33.6  RDW 13.5  PLT 153   BNPNo results for input(s): BNP, PROBNP in the last 168 hours.  DDimer No results for input(s): DDIMER in the last 168 hours.   Radiology/Studies:  DG Chest 2 View  Result Date: 12/24/2020 CLINICAL DATA:  Chest pain. EXAM: CHEST - 2 VIEW COMPARISON:  10/09/2020 FINDINGS: Stable asymmetric elevation right hemidiaphragm. Tiny nodular densities in the right lung compatible with calcified granulomas seen on chest CT 10/09/2020. No evidence for pneumothorax. No focal airspace consolidation. No evidence for pleural effusion. The cardiopericardial silhouette is within normal limits for size. The visualized bony structures of the thorax show no acute abnormality. IMPRESSION: No active cardiopulmonary disease. Electronically Signed   By: Kennith Center M.D.   On: 12/24/2020 11:01     Assessment and Plan:   Acute coronary syndrome with original EKG presenting with STEMI criteria, subsequent EKG showing  resolution of ST segment elevation -Urgent cardiac catheterization risks and benefits have been explained including stroke heart attack death renal impairment bleeding.  He is willing to proceed. -He is currently getting IV fluids given his creatinine of 1.8.  Usually is in the 1.6 range. -Troponin at 1045 was 1386.  Diabetes with hypertension and hyperlipidemia -Continue with goal-directed medical therapy.  May benefit from SGLT2 inhibitor.  CRITICAL CARE Performed by: Donato Schultz   Total critical care time: 40 minutes  Critical care time was exclusive of separately billable procedures and treating other patients.  Critical care was necessary to treat or prevent imminent or life-threatening deterioration.  Critical care was time spent personally by me on the following activities: development of treatment plan with patient and/or surrogate as well as nursing, discussions with consultants, evaluation of patient's response to treatment, examination of patient, obtaining history from patient or surrogate, ordering and performing treatments and interventions, ordering and review of laboratory studies, ordering and review of radiographic studies, pulse oximetry and re-evaluation of patient's condition.    Risk Assessment/Risk Scores:     TIMI Risk Score for ST  Elevation MI:   The patient's TIMI risk score is 11, which indicates a 35.9% risk of all cause mortality at 30 days.      Severity of Illness: The appropriate patient status for this patient is INPATIENT. Inpatient status is judged to be reasonable and necessary in order to provide the required intensity of service to ensure the patient's safety. The patient's presenting symptoms, physical exam findings, and initial radiographic and laboratory data in the context of their chronic comorbidities is felt to place them at high risk for further clinical deterioration. Furthermore, it is not anticipated that the patient will be medically  stable for discharge from the hospital within 2 midnights of admission. The following factors support the patient status of inpatient.   " The patient's presenting symptoms include heart attack. " The worrisome physical exam findings include chest pain, tachycardia. " The initial radiographic and laboratory data are worrisome because of elevated troponin compatible with acute coronary syndrome. " The chronic co-morbidities include  diabetes.   * I certify that at the point of admission it is my clinical judgment that the patient will require inpatient hospital care spanning beyond 2 midnights from the point of admission due to high intensity of service, high risk for further deterioration and high frequency of surveillance required.*    For questions or updates, please contact [facility] Please consult [email or web address] for contact info under     Signed, Donato Schultz, MD  12/24/2020 1:51 PM

## 2020-12-24 NOTE — Interval H&P Note (Signed)
Cath Lab Visit (complete for each Cath Lab visit)  Clinical Evaluation Leading to the Procedure:   ACS: Yes.    Non-ACS:    Anginal Classification: CCS IV  Anti-ischemic medical therapy: Minimal Therapy (1 class of medications)  Non-Invasive Test Results: No non-invasive testing performed  Prior CABG: No previous CABG      History and Physical Interval Note:  12/24/2020 2:07 PM  Dallie Piles  has presented today for surgery, with the diagnosis of chest pain.  The various methods of treatment have been discussed with the patient and family. After consideration of risks, benefits and other options for treatment, the patient has consented to  Procedure(s): LEFT HEART CATH AND CORONARY ANGIOGRAPHY (N/A) as a surgical intervention.  The patient's history has been reviewed, patient examined, no change in status, stable for surgery.  I have reviewed the patient's chart and labs.  Questions were answered to the patient's satisfaction.     Nicki Guadalajara

## 2020-12-24 NOTE — ED Notes (Signed)
Pt transported to cath lab 3, report given to cath lab RN. Wife is in 2nd floor waiting room & has pt's belongings.

## 2020-12-24 NOTE — Progress Notes (Signed)
ANTICOAGULATION CONSULT NOTE - Initial Consult  Pharmacy Consult for heparin Indication: ACS/STEMI  No Known Allergies  Patient Measurements: Height: 5\' 10"  (177.8 cm) Weight: 98 kg (216 lb) IBW/kg (Calculated) : 73 Heparin Dosing Weight: 93.3 kg  Vital Signs: Temp: 98.3 F (36.8 C) (02/11 1205) Temp Source: Oral (02/11 1205) BP: 150/101 (02/11 1249) Pulse Rate: 79 (02/11 1249)  Labs: Recent Labs    12/24/20 1045  HGB 15.8  HCT 47.0  PLT 153  CREATININE 1.83*  TROPONINIHS 1,386*    Estimated Creatinine Clearance: 40.3 mL/min (A) (by C-G formula based on SCr of 1.83 mg/dL (H)).   Medical History: Past Medical History:  Diagnosis Date  . Diabetes mellitus without complication (HCC)   . Gout 06/06/2013  . Hypertension   . Hypothyroidism   . Nephrolithiasis 06/06/2013  . Other and unspecified hyperlipidemia 06/06/2013    Medications:  Scheduled:  . aspirin  324 mg Oral Once  . heparin  4,000 Units Intravenous Once    Assessment: 75 yo male admitted for chest pain.  PMH of HTN but no other cardiac hx.  States his BP has been elevated recently.  CBC stable, noted elevated troponin. EKG showed right bundle branch block and anterior ST elevation without reciopocal changes. Pharmacy consulted to dose heparin.   Goal of Therapy:  Heparin level 0.3-0.7 units/ml Monitor platelets by anticoagulation protocol: Yes   Plan:  Heparin 4000 units bolus x1 (RN is giving now, will not bolus from bag) Start heparin drip at 1200 units/hr  F/u 6 hour HL Daily CBC Monitor s/sx bleeding    73, PharmD PGY-1 Acute Care Pharmacy Resident Office: [phone] 12/24/2020 1:18 PM

## 2020-12-24 NOTE — ED Notes (Signed)
Wife is now at bedside.

## 2020-12-24 NOTE — ED Provider Notes (Signed)
[facility] EMERGENCY DEPARTMENT Provider Note   CSN: [id number] Arrival date & time: 12/24/20  1037     History Chief Complaint  Patient presents with  . Chest Pain    Victor Hutchinson is a 75 y.o. male.  HPI Patient presents with chest pain.  Had episodes 2 days ago some along the way and again this morning.  Dull in his mid chest.  Pressure.  States it was real severe.  States she was nauseous with it.  Initially blood pressure was very elevated on Wednesday.  Also been somewhat elevated this morning. Had another episode that started this morning. States he been doing well yesterday. Able to do normal activity yesterday. States that his heart rate had gone fast and slow down this morning. He was dying because of it..  Pain-free now.  Has not come on with exertion.  No known cardiac history.  States he did have a similar episode a few months ago seen in the ER and was thought not to be cardiac at this time.  Does not smoke.  Is diabetic.  No known coronary artery disease.  Had stress test years ago but none recently.    Past Medical History:  Diagnosis Date  . Diabetes mellitus without complication (HCC)   . Gout 06/06/2013  . Hypertension   . Hypothyroidism   . Nephrolithiasis 06/06/2013  . Other and unspecified hyperlipidemia 06/06/2013    Patient Active Problem List   Diagnosis Date Noted  . Acute URI 03/01/2017  . Cough 03/01/2017  . Chest congestion 03/01/2017  . UTI (urinary tract infection) 06/06/2013  . Nephrolithiasis 06/06/2013  . Type II diabetes mellitus (HCC) 06/06/2013  . Essential hypertension, benign 06/06/2013  . Other and unspecified hyperlipidemia 06/06/2013  . Unspecified hypothyroidism 06/06/2013    History reviewed. No pertinent surgical history.     Family History  Problem Relation Age of Onset  . Heart disease Sister     Social History   Tobacco Use  . Smoking status: Never Smoker  . Smokeless tobacco: Never Used   Substance Use Topics  . Alcohol use: No  . Drug use: No    Home Medications Prior to Admission medications   Medication Sig Start Date End Date Taking? Authorizing Provider  amLODipine (NORVASC) 5 MG tablet Take 5 mg by mouth daily.    [provider]  benzonatate (TESSALON) 200 MG capsule Take 1 capsule (200 mg total) by mouth 2 (two) times daily as needed for cough. 03/01/17   Georgina Quint, MD  chlorpheniramine-phenylephrine-dextromethorphan [facility] DM) 3.5-1-3 MG/ML solution Take 1 mL by mouth every 6 (six) hours as needed for cough. Patient not taking: Reported on 03/01/2017 09/23/16   Ofilia Neas, PA-C  febuxostat (ULORIC) 40 MG tablet Take 40 mg by mouth daily.    [provider]  glipiZIDE (GLUCOTROL) 5 MG tablet Take 5 mg by mouth every morning.     [provider]  levothyroxine (SYNTHROID, LEVOTHROID) 25 MCG tablet Take 1 tablet (25 mcg total) by mouth daily. PATIENT NEEDS OFFICE VISIT FOR ADDITIONAL REFILLS    Godfrey Pick, PA-C  losartan (COZAAR) 100 MG tablet Take 100 mg by mouth daily.    [provider]  nystatin (MYCOSTATIN) 100000 UNIT/ML suspension Take 5 mLs (500,000 Units total) by mouth 4 (four) times daily. 03/10/17   Sharlene Dory, DO  promethazine-codeine (PHENERGAN WITH CODEINE) 6.25-10 MG/5ML syrup Take 5 mLs by mouth at bedtime as needed for cough.  03/01/17   Georgina Quint, MD  tamsulosin (FLOMAX) 0.4 MG CAPS capsule Take 1 capsule (0.4 mg total) by mouth daily after supper. 07/12/13   Collene Gobble, MD    Allergies    Patient has no known allergies.  Review of Systems   Review of Systems  Constitutional: Negative for appetite change and diaphoresis.  HENT: Negative for congestion.   Respiratory: Negative for shortness of breath.   Cardiovascular: Positive for chest pain.  Gastrointestinal: Positive for nausea.  Genitourinary: Negative for flank pain.  Musculoskeletal: Negative for back  pain.  Skin: Negative for pallor.  Neurological: Negative for weakness.  Psychiatric/Behavioral: Negative for confusion.    Physical Exam Updated Vital Signs BP (!) 157/86   Pulse 83   Temp 98.3 F (36.8 C) (Oral)   Resp 19   Ht 5\' 10"  (1.778 m)   Wt 98 kg   SpO2 100%   BMI 30.99 kg/m   Physical Exam Vitals and nursing note reviewed.  Constitutional:      Appearance: He is well-developed.  HENT:     Head: Normocephalic.  Cardiovascular:     Rate and Rhythm: Normal rate and regular rhythm.     Heart sounds: No murmur heard.   Pulmonary:     Breath sounds: No wheezing or rhonchi.  Abdominal:     Tenderness: There is no abdominal tenderness.  Musculoskeletal:     Right lower leg: No edema.     Left lower leg: No edema.  Skin:    General: Skin is warm.     Capillary Refill: Capillary refill takes less than 2 seconds.  Neurological:     Mental Status: He is alert and oriented to person, place, and time.     ED Results / Procedures / Treatments   Labs (all labs ordered are listed, but only abnormal results are displayed) Labs Reviewed  BASIC METABOLIC PANEL - Abnormal; Notable for the following components:      Result Value   CO2 19 (*)    Glucose, Bld 163 (*)    BUN 30 (*)    Creatinine, Ser 1.83 (*)    GFR, Estimated 38 (*)    All other components within normal limits  TROPONIN I (HIGH SENSITIVITY) - Abnormal; Notable for the following components:   Troponin I (High Sensitivity) 1,386 (*)    All other components within normal limits  RESP PANEL BY RT-PCR (FLU A&B, COVID) ARPGX2  CBC  HEMOGLOBIN A1C  PROTIME-INR  APTT  LIPID PANEL  HEPARIN LEVEL (UNFRACTIONATED)  TROPONIN I (HIGH SENSITIVITY)    EKG EKG Interpretation  Date/Time:  Friday December 24 2020 12:52:53 EST Ventricular Rate:  79 PR Interval:    QRS Duration: 134 QT Interval:  419 QTC Calculation: 481 R Axis:   -26 Text Interpretation: Sinus rhythm Prolonged PR interval Right bundle  branch block Nonspecific T abnormalities, lateral leads Confirmed by [id number] [phone]) on 12/24/2020 12:57:59 PM   Radiology DG Chest 2 View  Result Date: 12/24/2020 CLINICAL DATA:  Chest pain. EXAM: CHEST - 2 VIEW COMPARISON:  10/09/2020 FINDINGS: Stable asymmetric elevation right hemidiaphragm. Tiny nodular densities in the right lung compatible with calcified granulomas seen on chest CT 10/09/2020. No evidence for pneumothorax. No focal airspace consolidation. No evidence for pleural effusion. The cardiopericardial silhouette is within normal limits for size. The visualized bony structures of the thorax show no acute abnormality. IMPRESSION: No active cardiopulmonary disease. Electronically Signed   By: 10/11/2020.D.  On: 12/24/2020 11:01    Procedures Procedures   Medications Ordered in ED Medications  0.9 %  sodium chloride infusion ( Intravenous New Bag/Given (Non-Interop) 12/24/20 1314)  heparin ADULT infusion 100 units/mL (25000 units/254mL) (has no administration in time range)  aspirin chewable tablet 324 mg (324 mg Oral Given 12/24/20 1318)  heparin injection 4,000 Units (4,000 Units Intravenous Given 12/24/20 1316)    ED Course  I have reviewed the triage vital signs and the nursing notes.  Pertinent labs & imaging results that were available during my care of the patient were reviewed by me and considered in my medical decision making (see chart for details).    MDM Rules/Calculators/A&P                          Patient with chest pain.  Had episode Wednesday and Thursday and again this morning.  Initial EKG shows some ST elevation anteriorly but less than 2 mm in V2 and V3.  Not clearly STEMI.  However does have elevated troponin.  Had pain-free yesterday but had pain again this morning.  Repeat EKG done upon arrival to room shows resolution of EKG changes.  Patient is also pain-free.  Patient started on aspirin and heparin.  Discussed with Dr. Anne Fu with  cardiology.  Will need urgent Cath Lab likely.  He will help to arrange this.  CRITICAL CARE Performed by: Benjiman Core Total critical care time: 30 minutes Critical care time was exclusive of separately billable procedures and treating other patients. Critical care was necessary to treat or prevent imminent or life-threatening deterioration. Critical care was time spent personally by me on the following activities: development of treatment plan with patient and/or surrogate as well as nursing, discussions with consultants, evaluation of patient's response to treatment, examination of patient, obtaining history from patient or surrogate, ordering and performing treatments and interventions, ordering and review of laboratory studies, ordering and review of radiographic studies, pulse oximetry and re-evaluation of patient's condition.  Final Clinical Impression(s) / ED Diagnoses Final diagnoses:  ACS (acute coronary syndrome) [facility])    Rx / DC Orders ED Discharge Orders    None       Benjiman Core, MD 12/24/20 1323

## 2020-12-24 NOTE — ED Notes (Signed)
Dr Rubin Payor made aware of critical troponin, pt being moved to treatment room at this time

## 2020-12-24 NOTE — ED Triage Notes (Signed)
Patient complains of chest pain that started two days ago, resolved and returned this morning around 0830 in the center of the chest. Patient also reports nausea without emesis and hypertension despite taking is antihypertensive medications.

## 2020-12-24 NOTE — ED Notes (Signed)
radiolucent pads at bedside if needed.

## 2020-12-25 ENCOUNTER — Inpatient Hospital Stay (HOSPITAL_COMMUNITY): Payer: No Typology Code available for payment source

## 2020-12-25 ENCOUNTER — Encounter (HOSPITAL_COMMUNITY): Payer: Self-pay | Admitting: Cardiology

## 2020-12-25 DIAGNOSIS — E785 Hyperlipidemia, unspecified: Secondary | ICD-10-CM

## 2020-12-25 DIAGNOSIS — I214 Non-ST elevation (NSTEMI) myocardial infarction: Secondary | ICD-10-CM

## 2020-12-25 DIAGNOSIS — I10 Essential (primary) hypertension: Secondary | ICD-10-CM

## 2020-12-25 DIAGNOSIS — E039 Hypothyroidism, unspecified: Secondary | ICD-10-CM

## 2020-12-25 LAB — CBC
HCT: 41.7 % (ref 39.0–52.0)
Hemoglobin: 14.3 g/dL (ref 13.0–17.0)
MCH: 29.3 pg (ref 26.0–34.0)
MCHC: 34.3 g/dL (ref 30.0–36.0)
MCV: 85.5 fL (ref 80.0–100.0)
Platelets: 145 10*3/uL — ABNORMAL LOW (ref 150–400)
RBC: 4.88 MIL/uL (ref 4.22–5.81)
RDW: 13.4 % (ref 11.5–15.5)
WBC: 5.9 10*3/uL (ref 4.0–10.5)
nRBC: 0 % (ref 0.0–0.2)

## 2020-12-25 LAB — COMPREHENSIVE METABOLIC PANEL
ALT: 19 U/L (ref 0–44)
AST: 29 U/L (ref 15–41)
Albumin: 3.3 g/dL — ABNORMAL LOW (ref 3.5–5.0)
Alkaline Phosphatase: 80 U/L (ref 38–126)
Anion gap: 9 (ref 5–15)
BUN: 18 mg/dL (ref 8–23)
CO2: 22 mmol/L (ref 22–32)
Calcium: 9.2 mg/dL (ref 8.9–10.3)
Chloride: 108 mmol/L (ref 98–111)
Creatinine, Ser: 1.66 mg/dL — ABNORMAL HIGH (ref 0.61–1.24)
GFR, Estimated: 42 mL/min — ABNORMAL LOW (ref >60)
Glucose, Bld: 165 mg/dL — ABNORMAL HIGH (ref 70–99)
Potassium: 4.1 mmol/L (ref 3.5–5.1)
Sodium: 139 mmol/L (ref 135–145)
Total Bilirubin: 1.1 mg/dL (ref 0.3–1.2)
Total Protein: 6.5 g/dL (ref 6.5–8.1)

## 2020-12-25 LAB — ECHOCARDIOGRAM COMPLETE
Area-P 1/2: 3.42 cm2
Height: 70 in
S' Lateral: 3.3 cm
Single Plane A4C EF: 47 %
Weight: 3403.9 oz

## 2020-12-25 LAB — GLUCOSE, CAPILLARY
Glucose-Capillary: 167 mg/dL — ABNORMAL HIGH (ref 70–99)
Glucose-Capillary: 151 mg/dL — ABNORMAL HIGH (ref 70–99)
Glucose-Capillary: 145 mg/dL — ABNORMAL HIGH (ref 70–99)
Glucose-Capillary: 168 mg/dL — ABNORMAL HIGH (ref 70–99)

## 2020-12-25 MED ORDER — ROSUVASTATIN CALCIUM 20 MG PO TABS
20.0000 mg | ORAL_TABLET | Freq: Every day | ORAL | Status: DC
  Administered 2020-12-25 – 2020-12-26 (×2): 20 mg via ORAL
  Filled 2020-12-25 (×2): qty 1

## 2020-12-25 MED ORDER — METOPROLOL TARTRATE 12.5 MG HALF TABLET
12.5000 mg | ORAL_TABLET | Freq: Two times a day (BID) | ORAL | Status: DC
  Administered 2020-12-25 – 2020-12-26 (×3): 12.5 mg via ORAL
  Filled 2020-12-25 (×3): qty 1

## 2020-12-25 MED ORDER — CHLORHEXIDINE GLUCONATE CLOTH 2 % EX PADS: 6.0000 | MEDICATED_PAD | Freq: Every day | CUTANEOUS | Status: DC

## 2020-12-25 NOTE — Progress Notes (Signed)
CARDIAC REHAB PHASE I   PRE:  Rate/Rhythm: 89 SR  BP:  Supine:   Sitting: 111/73  Standing:    SaO2: 98%RA  MODE:  Ambulation: 370 ft   POST:  Rate/Rhythm: 106 ST  BP:  Supine:   Sitting: 143/71  Standing:    SaO2: 97%RA 0920-1015 Pt walked 370 ft on RA with steady gait and no CP. Tolerated well. MI education completed with pt who voiced understanding. Stressed importance of brilinta with stent. Reviewed MI restrictions, NTG use, heart healthy and low carb food choices (diets given ), walking instructions for ex, and CRP 2. Referred to [place] program. Assisted to recliner and then back to bed when ECHO arrived.   Luetta Nutting, RN BSN  12/25/2020 10:13 AM

## 2020-12-25 NOTE — Plan of Care (Signed)

## 2020-12-25 NOTE — Progress Notes (Signed)
  Echocardiogram 2D Echocardiogram has been performed.  Delcie Roch 12/25/2020, 10:49 AM

## 2020-12-25 NOTE — Progress Notes (Signed)
Progress Note  Patient Name: Victor Hutchinson Date of Encounter: 12/25/2020  Primary Cardiologist: Donato Schultz, MD   Subjective   75 yo M with DM with HTN, DM with HLD complicated by statin intolerance, CKD Stage IIIb (1.5-1.8) who presentes 12/24/20 with NSTEMI.  Had [facility] 12/25/20 with moderate LV dysfunction on LV gram and mLAD PCI.    Patient notes that he is doing great.  Since day prior notes that he feels 20 years younger changes.    No chest pain or pressure .  No SOB/DOE and no PND/Orthopnea.  No  leg swelling.  No palpitations or syncope .   Inpatient Medications    Scheduled Meds: . aspirin  81 mg Oral Daily  . Chlorhexidine Gluconate Cloth  6 each Topical Daily  . febuxostat  40 mg Oral Daily  . heparin injection (subcutaneous)  5,000 Units Subcutaneous Q8H  . insulin aspart  0-15 Units Subcutaneous TID WC  . insulin aspart  0-5 Units Subcutaneous QHS  . levothyroxine  25 mcg Oral Daily  . metoprolol tartrate  12.5 mg Oral BID  . rosuvastatin  20 mg Oral Daily  . sodium chloride flush  3 mL Intravenous Q12H  . sodium chloride flush  3 mL Intravenous Q12H  . tamsulosin  0.4 mg Oral QPC supper  . ticagrelor  90 mg Oral BID   Continuous Infusions: . sodium chloride Stopped (12/24/20 1623)  . sodium chloride    . sodium chloride    . sodium chloride 75 mL/hr at 12/25/20 0000  . sodium chloride     PRN Meds: sodium chloride, sodium chloride, acetaminophen, ALPRAZolam, diazepam, nitroGLYCERIN, ondansetron (ZOFRAN) IV, sodium chloride flush, sodium chloride flush, zolpidem   Vital Signs    Vitals:   12/25/20 0400 12/25/20 0500 12/25/20 0600 12/25/20 0700  BP: 118/75 110/68 137/70 (!) 122/93  Pulse: 73 77 76 78  Resp: 20 17 20 19   Temp:      TempSrc:      SpO2: 96% 98% 98% 98%  Weight: 96.5 kg     Height:        Intake/Output Summary (Last 24 hours) at 12/25/2020 0759 Last data filed at 12/25/2020 0730 Gross per 24 hour  Intake 120 ml  Output 1475 ml   Net -1355 ml   Filed Weights   12/24/20 1205 12/24/20 2000 12/25/20 0400  Weight: 98 kg 96.5 kg 96.5 kg    Telemetry    Occasional PVCs in couplets with artifact noted - Personally Reviewed  ECG    SR 1st Hb PR 250, RBBB - Personally Reviewed 12/25/20  Physical Exam   GEN: No acute distress.   Neck: No JVD Cardiac: RRR, no murmurs, rubs, or gallops.  No R radial bruit or hematoma Respiratory: Clear to auscultation bilaterally. GI: Soft, nontender, non-distended  MS: No edema; No deformity. Neuro:  Nonfocal  Psych: Normal affect   Labs    Chemistry Recent Labs  Lab 12/24/20 1045 12/25/20 0200  NA 140 139  K 3.7 4.1  CL 109 108  CO2 19* 22  GLUCOSE 163* 165*  BUN 30* 18  CREATININE 1.83* 1.66*  CALCIUM 9.8 9.2  PROT  --  6.5  ALBUMIN  --  3.3*  AST  --  29  ALT  --  19  ALKPHOS  --  80  BILITOT  --  1.1  GFRNONAA 38* 42*  ANIONGAP 12 9     Hematology Recent Labs  Lab 12/24/20 1045 12/25/20  0200  WBC 4.8 5.9  RBC 5.42 4.88  HGB 15.8 14.3  HCT 47.0 41.7  MCV 86.7 85.5  MCH 29.2 29.3  MCHC 33.6 34.3  RDW 13.5 13.4  PLT 153 145*    Cardiac EnzymesNo results for input(s): TROPONINI in the last 168 hours. No results for input(s): TROPIPOC in the last 168 hours.   BNPNo results for input(s): BNP, PROBNP in the last 168 hours.   DDimer No results for input(s): DDIMER in the last 168 hours.   Radiology    DG Chest 2 View  Result Date: 12/24/2020 CLINICAL DATA:  Chest pain. EXAM: CHEST - 2 VIEW COMPARISON:  10/09/2020 FINDINGS: Stable asymmetric elevation right hemidiaphragm. Tiny nodular densities in the right lung compatible with calcified granulomas seen on chest CT 10/09/2020. No evidence for pneumothorax. No focal airspace consolidation. No evidence for pleural effusion. The cardiopericardial silhouette is within normal limits for size. The visualized bony structures of the thorax show no acute abnormality. IMPRESSION: No active cardiopulmonary  disease. Electronically Signed   By: Kennith Center M.D.   On: 12/24/2020 11:01   CARDIAC CATHETERIZATION  Result Date: 12/24/2020  Prox RCA lesion is 20% stenosed.  Mid RCA lesion is 20% stenosed.  2nd Mrg lesion is 50% stenosed.  Mid Cx to Dist Cx lesion is 45% stenosed.  Mid LAD lesion is 95% stenosed.  Post intervention, there is a 0% residual stenosis.  There is moderate left ventricular systolic dysfunction.  A stent was successfully placed.  Acute coronary syndrome secondary to subtotal 95% mid LAD stenosis. Mild -moderate concomitant CAD with 50% ostial OM2  stenosis and diffuse 40 to 50% mid AV groove stenosis; and 20% proximal and mid RCA stenoses. Moderate LV dysfunction with hypocontractility involving the mid distal anterolateral wall and apex, with EF estimated at 40%.  LVEDP 17 mm. Successful PCI to the mid LAD with ultimate insertion of a 2.5 x 18 mm Resolute Onyx DES stent postdilated to 2.54 mm with the stenosis being reduced to 0%. RECOMMENDATION: DAPT for minimum of 1 year.  Medical therapy for concomitant CAD.  Aggressive lipid management with target LDL less than 70 and optimal blood pressure control.  With patient awakening from sleep with angina 2 days previously consider future evaluation for possible obstructive sleep apnea contributing to nocturnal hypoxemia.    Cardiac Studies   Mild decrease in :LV function, s/p PCI  Patient Profile     75 y.o. male NSTEMI  Assessment & Plan    NSTMEI - asymptomatic  - anatomy: mLAD s/p PCI - continue ASA 81 mg; Continue Brilinta or other antiplatelet until one year - continue statin, goal LDL < 70; given prior statin intolerance, may have to stop and would not increase dose - continue BB (given HB will cautiously transition to succinate post echo if LV dysfunction) - echo is pending - PRN nitroglycerin - discussed cardiac rehab  CKD IIIa - will follow BMP, increased risk of having CIN  Hypothyroidism On  synthroid  BPH - on flomax  Gout - on Uloric, continue  Transfer Orders Placed to Floor Possible 12/26/20 Discharge [place] Heparin and SCDs for PPX Full Code  For questions or updates, please contact [facility] Please consult [email or web address] for contact info under Cardiology/STEMI.      Signed, Christell Constant, MD  12/25/2020, 7:59 AM

## 2020-12-25 NOTE — Progress Notes (Signed)
Received from 2H, alert and oriented, ambulatory.

## 2020-12-26 ENCOUNTER — Encounter (HOSPITAL_COMMUNITY): Payer: Self-pay | Admitting: Cardiology

## 2020-12-26 ENCOUNTER — Other Ambulatory Visit: Payer: Self-pay | Admitting: Physician Assistant

## 2020-12-26 DIAGNOSIS — N1832 Chronic kidney disease, stage 3b: Secondary | ICD-10-CM

## 2020-12-26 LAB — BASIC METABOLIC PANEL
Anion gap: 9 (ref 5–15)
BUN: 23 mg/dL (ref 8–23)
CO2: 21 mmol/L — ABNORMAL LOW (ref 22–32)
Calcium: 9.2 mg/dL (ref 8.9–10.3)
Chloride: 108 mmol/L (ref 98–111)
Creatinine, Ser: 1.81 mg/dL — ABNORMAL HIGH (ref 0.61–1.24)
GFR, Estimated: 38 mL/min — ABNORMAL LOW (ref >60)
Glucose, Bld: 148 mg/dL — ABNORMAL HIGH (ref 70–99)
Potassium: 4.9 mmol/L (ref 3.5–5.1)
Sodium: 138 mmol/L (ref 135–145)

## 2020-12-26 LAB — CBC
HCT: 40.4 % (ref 39.0–52.0)
Hemoglobin: 13.2 g/dL (ref 13.0–17.0)
MCH: 28.3 pg (ref 26.0–34.0)
MCHC: 32.7 g/dL (ref 30.0–36.0)
MCV: 86.7 fL (ref 80.0–100.0)
Platelets: 144 10*3/uL — ABNORMAL LOW (ref 150–400)
RBC: 4.66 MIL/uL (ref 4.22–5.81)
RDW: 13.6 % (ref 11.5–15.5)
WBC: 5 10*3/uL (ref 4.0–10.5)
nRBC: 0 % (ref 0.0–0.2)

## 2020-12-26 LAB — GLUCOSE, CAPILLARY: Glucose-Capillary: 149 mg/dL — ABNORMAL HIGH (ref 70–99)

## 2020-12-26 MED ORDER — ROSUVASTATIN CALCIUM 20 MG PO TABS: 20.0000 mg | ORAL_TABLET | Freq: Every day | ORAL | 5 refills | Status: DC

## 2020-12-26 MED ORDER — TICAGRELOR 90 MG PO TABS
90.0000 mg | ORAL_TABLET | Freq: Two times a day (BID) | ORAL | 3 refills | Status: DC
  Filled 2021-02-25 – 2021-03-16 (×2): qty 180, 90d supply, fill #0
  Filled 2021-06-10: qty 180, 90d supply, fill #1
  Filled 2021-09-02 (×2): qty 180, 90d supply, fill #2
  Filled 2021-12-26: qty 60, 30d supply, fill #3

## 2020-12-26 MED ORDER — ASPIRIN 81 MG PO CHEW: 81.0000 mg | CHEWABLE_TABLET | Freq: Every day | ORAL | Status: DC

## 2020-12-26 MED ORDER — METOPROLOL TARTRATE 25 MG PO TABS: 12.5000 mg | ORAL_TABLET | Freq: Two times a day (BID) | ORAL | 5 refills | Status: DC

## 2020-12-26 MED ORDER — NITROGLYCERIN 0.4 MG SL SUBL: 0.4000 mg | SUBLINGUAL_TABLET | SUBLINGUAL | 3 refills | Status: DC | PRN

## 2020-12-26 MED ORDER — TICAGRELOR 90 MG PO TABS: 90.0000 mg | ORAL_TABLET | Freq: Two times a day (BID) | ORAL | 0 refills | Status: DC

## 2020-12-26 NOTE — Progress Notes (Addendum)
Progress Note  Patient Name: Victor Hutchinson Date of Encounter: 12/26/2020  Primary Cardiologist:   Donato Schultz, MD   Subjective   This patient walked in the hallways quite a bit and he feels great.  No pain.  No SOB.   Inpatient Medications    Scheduled Meds: . aspirin  81 mg Oral Daily  . Chlorhexidine Gluconate Cloth  6 each Topical Daily  . febuxostat  40 mg Oral Daily  . heparin injection (subcutaneous)  5,000 Units Subcutaneous Q8H  . insulin aspart  0-15 Units Subcutaneous TID WC  . insulin aspart  0-5 Units Subcutaneous QHS  . levothyroxine  25 mcg Oral Daily  . metoprolol tartrate  12.5 mg Oral BID  . rosuvastatin  20 mg Oral Daily  . sodium chloride flush  3 mL Intravenous Q12H  . sodium chloride flush  3 mL Intravenous Q12H  . tamsulosin  0.4 mg Oral QPC supper  . ticagrelor  90 mg Oral BID   Continuous Infusions: . sodium chloride Stopped (12/24/20 1623)  . sodium chloride Stopped (12/26/20 0435)  . sodium chloride Stopped (12/26/20 0435)  . sodium chloride Stopped (12/25/20 0931)  . sodium chloride     PRN Meds: sodium chloride, sodium chloride, acetaminophen, ALPRAZolam, diazepam, nitroGLYCERIN, ondansetron (ZOFRAN) IV, sodium chloride flush, sodium chloride flush, zolpidem   Vital Signs    Vitals:   12/25/20 1958 12/26/20 0347 12/26/20 0349 12/26/20 0820  BP:  129/76  127/77  Pulse:  69  79  Resp: 18 16    Temp:  98.1 F (36.7 C)  98.3 F (36.8 C)  TempSrc:  Oral  Oral  SpO2:  97%  94%  Weight:   96.2 kg   Height:        Intake/Output Summary (Last 24 hours) at 12/26/2020 0854 Last data filed at 12/26/2020 0600 Gross per 24 hour  Intake 544.28 ml  Output --  Net 544.28 ml   Filed Weights   12/24/20 2000 12/25/20 0400 12/26/20 0349  Weight: 96.5 kg 96.5 kg 96.2 kg    Telemetry    NSR - Personally Reviewed  ECG    NSR, RBBB, rate 75, anterior T wave inversions essentially unchanged from yesterday. - Personally  Reviewed  Physical Exam   GEN: No acute distress.   Neck: No  JVD Cardiac: RRR, no murmurs, rubs, or gallops.  Respiratory: Clear to auscultation bilaterally. GI: Soft, nontender, non-distended  MS: No edema; No deformity.  Right radial with slight bruising Neuro:  Nonfocal  Psych: Normal affect   Labs    Chemistry Recent Labs  Lab 12/24/20 1045 12/25/20 0200 12/26/20 0342  NA 140 139 138  K 3.7 4.1 4.9  CL 109 108 108  CO2 19* 22 21*  GLUCOSE 163* 165* 148*  BUN 30* 18 23  CREATININE 1.83* 1.66* 1.81*  CALCIUM 9.8 9.2 9.2  PROT  --  6.5  --   ALBUMIN  --  3.3*  --   AST  --  29  --   ALT  --  19  --   ALKPHOS  --  80  --   BILITOT  --  1.1  --   GFRNONAA 38* 42* 38*  ANIONGAP Hematology Recent Labs  Lab 12/24/20 1045 12/25/20 0200 12/26/20 0342  WBC 4.8 5.9 5.0  RBC 5.42 4.88 4.66  HGB 15.8 14.3 13.2  HCT 47.0 41.7 40.4  MCV 86.7 85.5 86.7  MCH  29.2 29.3 28.3  MCHC 33.6 34.3 32.7  RDW 13.5 13.4 13.6  PLT 153 145* 144*    Cardiac EnzymesNo results for input(s): TROPONINI in the last 168 hours. No results for input(s): TROPIPOC in the last 168 hours.   BNPNo results for input(s): BNP, PROBNP in the last 168 hours.   DDimer No results for input(s): DDIMER in the last 168 hours.   Radiology    DG Chest 2 View  Result Date: 12/24/2020 CLINICAL DATA:  Chest pain. EXAM: CHEST - 2 VIEW COMPARISON:  10/09/2020 FINDINGS: Stable asymmetric elevation right hemidiaphragm. Tiny nodular densities in the right lung compatible with calcified granulomas seen on chest CT 10/09/2020. No evidence for pneumothorax. No focal airspace consolidation. No evidence for pleural effusion. The cardiopericardial silhouette is within normal limits for size. The visualized bony structures of the thorax show no acute abnormality. IMPRESSION: No active cardiopulmonary disease. Electronically Signed   By: Kennith Center M.D.   On: 12/24/2020 11:01   CARDIAC  CATHETERIZATION  Result Date: 12/24/2020  Prox RCA lesion is 20% stenosed.  Mid RCA lesion is 20% stenosed.  2nd Mrg lesion is 50% stenosed.  Mid Cx to Dist Cx lesion is 45% stenosed.  Mid LAD lesion is 95% stenosed.  Post intervention, there is a 0% residual stenosis.  There is moderate left ventricular systolic dysfunction.  A stent was successfully placed.  Acute coronary syndrome secondary to subtotal 95% mid LAD stenosis. Mild -moderate concomitant CAD with 50% ostial OM2  stenosis and diffuse 40 to 50% mid AV groove stenosis; and 20% proximal and mid RCA stenoses. Moderate LV dysfunction with hypocontractility involving the mid distal anterolateral wall and apex, with EF estimated at 40%.  LVEDP 17 mm. Successful PCI to the mid LAD with ultimate insertion of a 2.5 x 18 mm Resolute Onyx DES stent postdilated to 2.54 mm with the stenosis being reduced to 0%. RECOMMENDATION: DAPT for minimum of 1 year.  Medical therapy for concomitant CAD.  Aggressive lipid management with target LDL less than 70 and optimal blood pressure control.  With patient awakening from sleep with angina 2 days previously consider future evaluation for possible obstructive sleep apnea contributing to nocturnal hypoxemia.   ECHOCARDIOGRAM COMPLETE  Result Date: 12/25/2020    ECHOCARDIOGRAM REPORT   Patient Name:   Victor Hutchinson Date of Exam: 12/25/2020 Medical Rec #:  [id number]        Height:       70.0 in Accession #:    [id number]       Weight:       212.7 lb Date of Birth:  1946/01/26         BSA:          2.143 m Patient Age:    75 years         BP:           117/68 mmHg Patient Gender: M                HR:           76 bpm. Exam Location:  Inpatient Procedure: 2D Echo Indications:    NSTEMI  History:        Patient has prior history of Echocardiogram examinations, most                 recent 01/05/2003. Chronic kidney disease; Risk                 Factors:Hypertension, Dyslipidemia  and Diabetes.  Sonographer:    Delcie Roch Referring Phys: [id number] RHONDA G BARRETT IMPRESSIONS  1. Left ventricular ejection fraction, by estimation, is 55%. The left ventricle has low normal function. There is mild hypokinesis of the mid-to-distal septal, mid-to-distal anterior and distal inferior LV wall segments. Left ventricular diastolic parameters are consistent with Grade I diastolic dysfunction (impaired relaxation).  2. Right ventricular systolic function is normal. The right ventricular size is normal. There is normal pulmonary artery systolic pressure.  3. The aortic valve is tricuspid. There is mild thickening of the aortic valve. Aortic valve regurgitation is not visualized.  4. The inferior vena cava is normal in size with greater than 50% respiratory variability, suggesting right atrial pressure of 3 mmHg.  5. The mitral valve is normal in structure. Trivial mitral valve regurgitation. Comparison(s): No prior TTE images viewable in our system. FINDINGS  Left Ventricle: Left ventricular ejection fraction, by estimation, is 50 to 55%. The left ventricle has low normal function. The left ventricle demonstrates regional wall motion abnormalities. There is mild hypokinesis of the mid-to-distal septal, mid-to-distal anterior and distal inferior LV wall segments. The left ventricular internal cavity size was normal in size. There is borderline concentric left ventricular hypertrophy. Left ventricular diastolic parameters are consistent with Grade I diastolic dysfunction (impaired relaxation). Right Ventricle: The right ventricular size is normal. No increase in right ventricular wall thickness. Right ventricular systolic function is normal. There is normal pulmonary artery systolic pressure. The tricuspid regurgitant velocity is 1.95 m/s, and  with an assumed right atrial pressure of 3 mmHg, the estimated right ventricular systolic pressure is 18.2 mmHg. Left Atrium: Left atrial size was normal in size. Right Atrium: Right atrial size was  normal in size. Pericardium: There is no evidence of pericardial effusion. Mitral Valve: The mitral valve is normal in structure. Trivial mitral valve regurgitation. Tricuspid Valve: The tricuspid valve is normal in structure. Tricuspid valve regurgitation is trivial. Aortic Valve: The aortic valve is tricuspid. There is mild thickening of the aortic valve. Aortic valve regurgitation is not visualized. Pulmonic Valve: The pulmonic valve was normal in structure. Pulmonic valve regurgitation is trivial. Aorta: The aortic root and ascending aorta are structurally normal, with no evidence of dilitation. Venous: The inferior vena cava is normal in size with greater than 50% respiratory variability, suggesting right atrial pressure of 3 mmHg. IAS/Shunts: No atrial level shunt detected by color flow Doppler.  LEFT VENTRICLE PLAX 2D LVIDd:         4.70 cm     Diastology LVIDs:         3.30 cm     LV e' medial:    5.87 cm/s LV PW:         1.10 cm     LV E/e' medial:  11.7 LV IVS:        1.00 cm     LV e' lateral:   8.38 cm/s LVOT diam:     2.10 cm     LV E/e' lateral: 8.2 LV SV:         70 LV SV Index:   32 LVOT Area:     3.46 cm  LV Volumes (MOD) LV vol d, MOD A4C: 80.4 ml LV vol s, MOD A4C: 42.6 ml LV SV MOD A4C:     80.4 ml RIGHT VENTRICLE             IVC RV S prime:     11.70 cm/s  IVC diam: 1.60  cm TAPSE (M-mode): 1.3 cm LEFT ATRIUM             Index       RIGHT ATRIUM           Index LA diam:        3.80 cm 1.77 cm/m  RA Area:     11.80 cm LA Vol (A2C):   41.7 ml 19.46 ml/m RA Volume:   26.70 ml  12.46 ml/m LA Vol (A4C):   46.4 ml 21.65 ml/m LA Biplane Vol: 45.0 ml 21.00 ml/m  AORTIC VALVE LVOT Vmax:   104.00 cm/s LVOT Vmean:  64.100 cm/s LVOT VTI:    0.201 m  AORTA Ao Root diam: 3.20 cm Ao Asc diam:  3.30 cm MITRAL VALVE                TRICUSPID VALVE MV Area (PHT): 3.42 cm     TR Peak grad:   15.2 mmHg MV Decel Time: 222 msec     TR Vmax:        195.00 cm/s MV E velocity: 68.60 cm/s MV A velocity: 100.00 cm/s   SHUNTS MV E/A ratio:  0.69         Systemic VTI:  0.20 m                             Systemic Diam: 2.10 cm Laurance Flatten MD Electronically signed by Laurance Flatten MD Signature Date/Time: 12/25/2020/1:46:21 PM    Final     Cardiac Studies   ECHO:    1. Left ventricular ejection fraction, by estimation, is 55%. The left  ventricle has low normal function. There is mild hypokinesis of the  mid-to-distal septal, mid-to-distal anterior and distal inferior LV wall  segments. Left ventricular diastolic  parameters are consistent with Grade I diastolic dysfunction (impaired  relaxation).  2. Right ventricular systolic function is normal. The right ventricular  size is normal. There is normal pulmonary artery systolic pressure.  3. The aortic valve is tricuspid. There is mild thickening of the aortic  valve. Aortic valve regurgitation is not visualized.  4. The inferior vena cava is normal in size with greater than 50%  respiratory variability, suggesting right atrial pressure of 3 mmHg.  5. The mitral valve is normal in structure. Trivial mitral valve  regurgitation.   Diagnostic Dominance: Right    Intervention      Patient Profile     75 y.o. male with HLD complicated by statin intolerance, CKD Stage IIIb (1.5-1.8) who presentes 12/24/20 with NSTEMI.  Had [facility] 12/25/20 with moderate LV dysfunction on LV gram and mLAD PCI.     Assessment & Plan    NSTEMI:   CAD/STENT as above.  Mild regional wall motion abnormality but overall EF is well preserved.     ASA, Brilinta, low dose beta blocker, statin   CKD IIIA:     Creat is up slightly.  He needs a repeat BMET early next week.   HYPOTHYROIDISM:    A1C was 6.9.  He has this followed at the [place].  We cannot write the prescriptions but I would suggest metformin and SGLT2i but we will need to defer to their management.    DYSLIPIDEMIA:  LDL was 142.   Triglycerides 451.   Intolerant to statin but will to take what is  prescribed.  FOLLOW UP TOC in our office 7 days. Dr. Anne Fu will be his new cardiologist.  For questions or updates, please contact [facility] Please consult [email or web address] for contact info under Cardiology/STEMI.   Signed, Rollene RotundaJames Shaniah Baltes, MD  12/26/2020, 8:54 AM

## 2020-12-26 NOTE — Care Management (Signed)
12-26-20 1059 Case Manager attempted to call patient to confirm that he has a Brilinta discount card-unable to reach the patient. Case Manager called the staff RN and she states that the patient has a 30 day free Brilinta card. Patient for transition home today. Gala Lewandowsky, RN,BSN Case Manager

## 2020-12-26 NOTE — Discharge Summary (Addendum)
Discharge Summary    Patient ID: Victor PilesRobert W Obriant MRN: [id number]; DOB: 08/02/1946  Admit date: 12/24/2020 Discharge date: 12/26/2020  [facility]  [facility]  Cardiologist:  Donato SchultzMark Skains, MD  Advanced Practice Provider:  None Electrophysiologist:  None    Discharge Diagnoses    Principal Problem:   Non-ST elevation (NSTEMI) myocardial infarction [facility](HCC) Active Problems:   Type II diabetes mellitus (HCC)   Essential hypertension, benign   Hyperlipidemia LDL goal <70   Hypothyroidism in adult   ACS (acute coronary syndrome) (HCC)   Acute coronary syndrome (HCC)   NSTEMI (non-ST elevated myocardial infarction) [facility](HCC)    Diagnostic Studies/Procedures     Cath 12/24/2020 Prox RCA lesion is 20% stenosed. Mid RCA lesion is 20% stenosed. 2nd Mrg lesion is 50% stenosed. Mid Cx to Dist Cx lesion is 45% stenosed. Mid LAD lesion is 95% stenosed. Post intervention, there is a 0% residual stenosis. There is moderate left ventricular systolic dysfunction. A stent was successfully placed.   Acute coronary syndrome secondary to subtotal 95% mid LAD stenosis.   Mild -moderate concomitant CAD with 50% ostial OM2  stenosis and diffuse 40 to 50% mid AV groove stenosis; and 20% proximal and mid RCA stenoses.   Moderate LV dysfunction with hypocontractility involving the mid distal anterolateral wall and apex, with EF estimated at 40%.  LVEDP 17 mm.   Successful PCI to the mid LAD with ultimate insertion of a 2.5 x 18 mm Resolute Onyx DES stent postdilated to 2.54 mm with the stenosis being reduced to 0%.   RECOMMENDATION: DAPT for minimum of 1 year.  Medical therapy for concomitant CAD.  Aggressive lipid management with target LDL less than 70 and optimal blood pressure control.  With patient awakening from sleep with angina 2 days previously consider future evaluation for possible obstructive sleep apnea contributing to nocturnal hypoxemia.     Diagnostic Dominance: Right    Intervention       Echo 12/25/2020 1. Left ventricular ejection fraction, by estimation, is 55%. The left  ventricle has low normal function. There is mild hypokinesis of the  mid-to-distal septal, mid-to-distal anterior and distal inferior LV wall  segments. Left ventricular diastolic  parameters are consistent with Grade I diastolic dysfunction (impaired  relaxation).   2. Right ventricular systolic function is normal. The right ventricular  size is normal. There is normal pulmonary artery systolic pressure.   3. The aortic valve is tricuspid. There is mild thickening of the aortic  valve. Aortic valve regurgitation is not visualized.   4. The inferior vena cava is normal in size with greater than 50%  respiratory variability, suggesting right atrial pressure of 3 mmHg.   5. The mitral valve is normal in structure. Trivial mitral valve  regurgitation.   Comparison(s): No prior TTE images viewable in our system.   _____________   History of Present Illness     Victor Hutchinson is a 75 y.o. male with diabetes hypertension hyperlipidemia with prior statin intolerance who presents today to the emergency department with severe crushing substernal chest discomfort starting at about 830 this morning after he woke up.  Gradually started then increased to 10 out of 10 severity.  Felt some radiation across his chest as well into his arms.  No significant shortness of breath or diaphoresis.  After this did not subside, he decided to drive himself to the hospital for further evaluation.  His wife Victor Hutchinson is an employee of  [employer].     His symptoms first started on Wednesday morning at 3:30 AM where he woke up and felt severe crushing chest discomfort that gradually resolved on its own.  He took his blood pressure at the time and it was highly elevated at 240/160 with his heart rate elevated as well at 130.  He took some deep breaths calm down and gradually  resolved.  He did not have another episode until earlier this morning.   His sister had heart disease and died after surgery he states.   He has a history of having "bad kidneys ".  Also has a history of diabetes on 2 oral medications.  He is not on Metformin.   Has no hx CAD.    His original EKG when presenting to the emergency department at 10:39 AM demonstrates sinus rhythm with right bundle branch block and approximately 1 mm of ST segment elevation in V3 through V6.  On repeat EKG at 1252, segments had resolved but T wave inversions are now present.   Currently he is fairly comfortable in bed, no significant chest discomfort at this time.      Hospital Course     Consultants: N/A  Given his initial symptom and EKG change, emergent cardiac catheterization was performed.  Cardiac catheterization performed on 12/24/2020 revealed a 20% proximal RCA, 20% mid RCA, 30% OM 2, 45% mid to distal left circumflex lesion, 95% mid LAD lesion treated with Resolute Onyx 2.5 x 18 mm DES postdilated to 2.54 mm.  EF 40% by LV gram with LVEDP 17 mmHg.  Postprocedure, patient was placed on aspirin and Brilinta.  Serial troponin trended up to 2502 before trending back down.  Lipid panel obtained in the afternoon revealed a total cholesterol 250, HDL 30, LDL 142, triglyceride 451.  Due to his poor renal function, he was aggressively hydrated with IV fluid.  Echocardiogram obtained on the following day shows EF 55%, mild hypokinesis of the mid to distal septal, mid to distal anterior and distal inferior LV wall segment, grade 1 DD, normal pulmonary artery systolic pressure, no significant valve issue.   Patient was seen in the morning of 12/26/2020 at which time he was able to ambulate in the hallways without any chest discomfort.  He is deemed stable for discharge from cardiac perspective.  Discharge medication include aspirin, Brilinta, low-dose beta-blocker and statin.  He will need a repeat fasting lipid panel  in 6 to 8 weeks.  During this admission, his hemoglobin A1c was 6.9, it is recommended to consider addition of metformin and SGLT2 inhibitor as outpatient, he usually have his diabetes managed at [facility], will defer to their management.  Note, patient also has a history of statin intolerance, if he is unable to tolerate the current dose of Crestor, will need to consider PCSK9 inhibitor.  Otherwise, we will set him up with a repeat basic metabolic panel next week to make sure his renal function stable.  We will also arrange a 7-day TOC follow-up with Dr. Anne Fu or his APP.  Discharge paper will be printed out for the patient to take it back to [place]   Did the patient have an acute coronary syndrome (MI, NSTEMI, STEMI, etc) this admission?:  Yes                               AHA/ACC Clinical Performance & Quality Measures: Aspirin prescribed? - Yes ADP Receptor Inhibitor (Plavix/Clopidogrel,  Brilinta/Ticagrelor or Effient/Prasugrel) prescribed (includes medically managed patients)? - Yes Beta Blocker prescribed? - Yes High Intensity Statin (Lipitor 40-80mg  or Crestor 20-40mg ) prescribed? - Yes EF assessed during THIS hospitalization? - Yes For EF <40%, was ACEI/ARB prescribed? - Not Applicable (EF >/= 40%) For EF <40%, Aldosterone Antagonist (Spironolactone or Eplerenone) prescribed? - Not Applicable (EF >/= 40%) Cardiac Rehab Phase II ordered (including medically managed patients)? - Yes       _____________  Discharge Vitals Blood pressure 127/77, pulse 79, temperature 98.3 F (36.8 C), temperature source Oral, resp. rate 16, height 5\' 10"  (1.778 m), weight 96.2 kg, SpO2 94 %.  Filed Weights   12/24/20 2000 12/25/20 0400 12/26/20 0349  Weight: 96.5 kg 96.5 kg 96.2 kg    Labs & Radiologic Studies    CBC Recent Labs    12/25/20 0200 12/26/20 0342  WBC 5.9 5.0  HGB 14.3 13.2  HCT 41.7 40.4  MCV 85.5 86.7  PLT 145* 144*   Basic Metabolic Panel Recent Labs    12/28/20 0200  12/26/20 0342  NA 139 138  K 4.1 4.9  CL 108 108  CO2 22 21*  GLUCOSE 165* 148*  BUN 18 23  CREATININE 1.66* 1.81*  CALCIUM 9.2 9.2   Liver Function Tests Recent Labs    12/25/20 0200  AST 29  ALT 19  ALKPHOS 80  BILITOT 1.1  PROT 6.5  ALBUMIN 3.3*   No results for input(s): LIPASE, AMYLASE in the last 72 hours. High Sensitivity Troponin:   Recent Labs  Lab 12/24/20 1045 12/24/20 1247 12/24/20 2056 12/24/20 2302  TROPONINIHS 1,386* 1,501* 2,502* 2,414*    BNP Invalid input(s): POCBNP D-Dimer No results for input(s): DDIMER in the last 72 hours. Hemoglobin A1C Recent Labs    12/24/20 1259  HGBA1C 6.9*   Fasting Lipid Panel Recent Labs    12/24/20 1259  CHOL 250*  HDL 30*  LDLCALC UNABLE TO CALCULATE IF TRIGLYCERIDE OVER 400 mg/dL  TRIG 02/21/21*  CHOLHDL 8.3  LDLDIRECT 142.4*   Thyroid Function Tests Recent Labs    12/24/20 1350  TSH 0.919   _____________  DG Chest 2 View  Result Date: 12/24/2020 CLINICAL DATA:  Chest pain. EXAM: CHEST - 2 VIEW COMPARISON:  10/09/2020 FINDINGS: Stable asymmetric elevation right hemidiaphragm. Tiny nodular densities in the right lung compatible with calcified granulomas seen on chest CT 10/09/2020. No evidence for pneumothorax. No focal airspace consolidation. No evidence for pleural effusion. The cardiopericardial silhouette is within normal limits for size. The visualized bony structures of the thorax show no acute abnormality. IMPRESSION: No active cardiopulmonary disease. Electronically Signed   By: 10/11/2020 M.D.   On: 12/24/2020 11:01   CARDIAC CATHETERIZATION  Result Date: 12/24/2020  Prox RCA lesion is 20% stenosed.  Mid RCA lesion is 20% stenosed.  2nd Mrg lesion is 50% stenosed.  Mid Cx to Dist Cx lesion is 45% stenosed.  Mid LAD lesion is 95% stenosed.  Post intervention, there is a 0% residual stenosis.  There is moderate left ventricular systolic dysfunction.  A stent was successfully placed.  Acute  coronary syndrome secondary to subtotal 95% mid LAD stenosis. Mild -moderate concomitant CAD with 50% ostial OM2  stenosis and diffuse 40 to 50% mid AV groove stenosis; and 20% proximal and mid RCA stenoses. Moderate LV dysfunction with hypocontractility involving the mid distal anterolateral wall and apex, with EF estimated at 40%.  LVEDP 17 mm. Successful PCI to the mid LAD with ultimate insertion of  a 2.5 x 18 mm Resolute Onyx DES stent postdilated to 2.54 mm with the stenosis being reduced to 0%. RECOMMENDATION: DAPT for minimum of 1 year.  Medical therapy for concomitant CAD.  Aggressive lipid management with target LDL less than 70 and optimal blood pressure control.  With patient awakening from sleep with angina 2 days previously consider future evaluation for possible obstructive sleep apnea contributing to nocturnal hypoxemia.   ECHOCARDIOGRAM COMPLETE  Result Date: 12/25/2020    ECHOCARDIOGRAM REPORT   Patient Name:   Victor Hutchinson Date of Exam: 12/25/2020 Medical Rec #:  [id number]        Height:       70.0 in Accession #:    [id number]       Weight:       212.7 lb Date of Birth:  01-19-1946         BSA:          2.143 m Patient Age:    75 years         BP:           117/68 mmHg Patient Gender: M                HR:           76 bpm. Exam Location:  Inpatient Procedure: 2D Echo Indications:    NSTEMI  History:        Patient has prior history of Echocardiogram examinations, most                 recent 01/05/2003. Chronic kidney disease; Risk                 Factors:Hypertension, Dyslipidemia and Diabetes.  Sonographer:    Delcie Roch Referring Phys: [id number] RHONDA G BARRETT IMPRESSIONS  1. Left ventricular ejection fraction, by estimation, is 55%. The left ventricle has low normal function. There is mild hypokinesis of the mid-to-distal septal, mid-to-distal anterior and distal inferior LV wall segments. Left ventricular diastolic parameters are consistent with Grade I diastolic dysfunction (impaired  relaxation).  2. Right ventricular systolic function is normal. The right ventricular size is normal. There is normal pulmonary artery systolic pressure.  3. The aortic valve is tricuspid. There is mild thickening of the aortic valve. Aortic valve regurgitation is not visualized.  4. The inferior vena cava is normal in size with greater than 50% respiratory variability, suggesting right atrial pressure of 3 mmHg.  5. The mitral valve is normal in structure. Trivial mitral valve regurgitation. Comparison(s): No prior TTE images viewable in our system. FINDINGS  Left Ventricle: Left ventricular ejection fraction, by estimation, is 50 to 55%. The left ventricle has low normal function. The left ventricle demonstrates regional wall motion abnormalities. There is mild hypokinesis of the mid-to-distal septal, mid-to-distal anterior and distal inferior LV wall segments. The left ventricular internal cavity size was normal in size. There is borderline concentric left ventricular hypertrophy. Left ventricular diastolic parameters are consistent with Grade I diastolic dysfunction (impaired relaxation). Right Ventricle: The right ventricular size is normal. No increase in right ventricular wall thickness. Right ventricular systolic function is normal. There is normal pulmonary artery systolic pressure. The tricuspid regurgitant velocity is 1.95 m/s, and  with an assumed right atrial pressure of 3 mmHg, the estimated right ventricular systolic pressure is 18.2 mmHg. Left Atrium: Left atrial size was normal in size. Right Atrium: Right atrial size was normal in size. Pericardium: There is no evidence of pericardial effusion. Mitral Valve:  The mitral valve is normal in structure. Trivial mitral valve regurgitation. Tricuspid Valve: The tricuspid valve is normal in structure. Tricuspid valve regurgitation is trivial. Aortic Valve: The aortic valve is tricuspid. There is mild thickening of the aortic valve. Aortic valve  regurgitation is not visualized. Pulmonic Valve: The pulmonic valve was normal in structure. Pulmonic valve regurgitation is trivial. Aorta: The aortic root and ascending aorta are structurally normal, with no evidence of dilitation. Venous: The inferior vena cava is normal in size with greater than 50% respiratory variability, suggesting right atrial pressure of 3 mmHg. IAS/Shunts: No atrial level shunt detected by color flow Doppler.  LEFT VENTRICLE PLAX 2D LVIDd:         4.70 cm     Diastology LVIDs:         3.30 cm     LV e' medial:    5.87 cm/s LV PW:         1.10 cm     LV E/e' medial:  11.7 LV IVS:        1.00 cm     LV e' lateral:   8.38 cm/s LVOT diam:     2.10 cm     LV E/e' lateral: 8.2 LV SV:         70 LV SV Index:   32 LVOT Area:     3.46 cm  LV Volumes (MOD) LV vol d, MOD A4C: 80.4 ml LV vol s, MOD A4C: 42.6 ml LV SV MOD A4C:     80.4 ml RIGHT VENTRICLE             IVC RV S prime:     11.70 cm/s  IVC diam: 1.60 cm TAPSE (M-mode): 1.3 cm LEFT ATRIUM             Index       RIGHT ATRIUM           Index LA diam:        3.80 cm 1.77 cm/m  RA Area:     11.80 cm LA Vol (A2C):   41.7 ml 19.46 ml/m RA Volume:   26.70 ml  12.46 ml/m LA Vol (A4C):   46.4 ml 21.65 ml/m LA Biplane Vol: 45.0 ml 21.00 ml/m  AORTIC VALVE LVOT Vmax:   104.00 cm/s LVOT Vmean:  64.100 cm/s LVOT VTI:    0.201 m  AORTA Ao Root diam: 3.20 cm Ao Asc diam:  3.30 cm MITRAL VALVE                TRICUSPID VALVE MV Area (PHT): 3.42 cm     TR Peak grad:   15.2 mmHg MV Decel Time: 222 msec     TR Vmax:        195.00 cm/s MV E velocity: 68.60 cm/s MV A velocity: 100.00 cm/s  SHUNTS MV E/A ratio:  0.69         Systemic VTI:  0.20 m                             Systemic Diam: 2.10 cm Laurance Flatten MD Electronically signed by Laurance Flatten MD Signature Date/Time: 12/25/2020/1:46:21 PM    Final    Disposition   Pt is being discharged home today in good condition.  Follow-up Plans & Appointments     Follow-up Information      Jake Bathe, MD Follow up.   Specialty: Cardiology Why: office scheduler will  contact you regarding the lab and 1 week follow up, please give [place] a call if you do not hear from our scheduler in 3 business days.  Contact information: [address] [place] [address] [phone]         [facility] Follow up on 12/31/2020.   Specialty: Cardiology Why: obtain BMET labwork in the office on Friday 2/18, no fasting needed.  Contact information: [address] [place] [address] [phone]               Discharge Instructions     Amb Referral to Cardiac Rehabilitation   Complete by: As directed    Diagnosis:  Coronary Stents NSTEMI     After initial evaluation and assessments completed: Virtual Based Care may be provided alone or in conjunction with Phase 2 Cardiac Rehab based on patient barriers.: Yes   Diet - low sodium heart healthy   Complete by: As directed    Discharge instructions   Complete by: As directed    No lifting over 5 lbs for 1 week. No sexual activity for 1 week. Keep procedure site clean & dry. If you notice increased pain, swelling, bleeding or pus, call/return!  You may shower, but no soaking baths/hot tubs/pools for 1 week.   Increase activity slowly   Complete by: As directed        Discharge Medications   Allergies as of 12/26/2020       Reactions   Statins    Had muscle aches in the past        Medication List     STOP taking these medications    amLODipine 5 MG tablet Commonly known as: NORVASC   losartan 100 MG tablet Commonly known as: COZAAR       TAKE these medications    aspirin 81 MG chewable tablet Chew 1 tablet (81 mg total) by mouth daily. Start taking on: December 27, 2020   febuxostat 40 MG tablet Commonly known as: ULORIC Take 40 mg by mouth daily.   glipiZIDE 5 MG tablet Commonly known as: GLUCOTROL Take 5 mg by mouth every  morning.   levothyroxine 25 MCG tablet Commonly known as: SYNTHROID Take 1 tablet (25 mcg total) by mouth daily. PATIENT NEEDS OFFICE VISIT FOR ADDITIONAL REFILLS   metoprolol tartrate 25 MG tablet Commonly known as: LOPRESSOR Take 0.5 tablets (12.5 mg total) by mouth 2 (two) times daily.   nitroGLYCERIN 0.4 MG SL tablet Commonly known as: NITROSTAT Place 1 tablet (0.4 mg total) under the tongue every 5 (five) minutes x 3 doses as needed for chest pain.   rosuvastatin 20 MG tablet Commonly known as: CRESTOR Take 1 tablet (20 mg total) by mouth daily. Start taking on: December 27, 2020   ticagrelor 90 MG Tabs tablet Commonly known as: BRILINTA Take 1 tablet (90 mg total) by mouth 2 (two) times daily.           Outstanding Labs/Studies   BMET in 1 week, FLP and LFT in 6-8 weeks  Duration of Discharge Encounter   Greater than 30 minutes including physician time.  Ramond Dial, PA 12/26/2020, 10:27 AM  Patient seen and examined.  Plan as discussed in my rounding note for today and outlined above. Fayrene Fearing [facility]  12/26/2020  11:36 AM

## 2020-12-27 ENCOUNTER — Encounter (HOSPITAL_COMMUNITY): Payer: Self-pay | Admitting: Cardiovascular Disease

## 2020-12-27 MED FILL — Bivalirudin Trifluoroacetate For IV Soln 250 MG (Base Equiv): INTRAVENOUS | Qty: 250 | Status: AC

## 2020-12-27 MED FILL — Verapamil HCl IV Soln 2.5 MG/ML: INTRAVENOUS | Qty: 2 | Status: AC

## 2020-12-29 ENCOUNTER — Other Ambulatory Visit: Payer: Self-pay | Admitting: *Deleted

## 2020-12-29 NOTE — Patient Outreach (Signed)
[facility] Customer service manager [facility]) Care Management  12/29/2020  [name]. 1946/05/16 [id number]   EMMI-GENERAL DISCHARGE-UNSUCCESSFUL RED ON EMMI ALERT Day #1 Date:12/28/2020 Red Alert: No follow up appointments  OUTREACH #1 RN attempted outreach call today however unsuccessful. Unable to leave a HIPAA approved voice message at this time.  Will rescheduled another outreach call over the next few days for [facility] services. Will also send outreach letter.   Elliot Cousin, RN Care Management Coordinator [facility] Main Office [phone]

## 2020-12-30 ENCOUNTER — Telehealth (HOSPITAL_COMMUNITY): Payer: Self-pay

## 2020-12-30 NOTE — Telephone Encounter (Signed)
Attempted to call patient in regards to Cardiac Rehab - unable to leave VM, VM box full  

## 2020-12-30 NOTE — Telephone Encounter (Signed)
Pt has [employer] and [employer], will verify pt insurance once confirm which insurance he would like to use.

## 2021-01-03 ENCOUNTER — Encounter: Payer: Self-pay | Admitting: Cardiology

## 2021-01-03 ENCOUNTER — Other Ambulatory Visit: Payer: Medicare HMO | Admitting: *Deleted

## 2021-01-03 ENCOUNTER — Other Ambulatory Visit: Payer: Self-pay

## 2021-01-03 ENCOUNTER — Ambulatory Visit: Payer: Medicare HMO | Admitting: Cardiology

## 2021-01-03 VITALS — BP 140/72 | HR 50 | Ht 70.0 in | Wt 210.0 lb

## 2021-01-03 DIAGNOSIS — I10 Essential (primary) hypertension: Secondary | ICD-10-CM

## 2021-01-03 DIAGNOSIS — Z79899 Other long term (current) drug therapy: Secondary | ICD-10-CM | POA: Diagnosis not present

## 2021-01-03 DIAGNOSIS — N1832 Chronic kidney disease, stage 3b: Secondary | ICD-10-CM | POA: Diagnosis not present

## 2021-01-03 DIAGNOSIS — I214 Non-ST elevation (NSTEMI) myocardial infarction: Secondary | ICD-10-CM | POA: Diagnosis not present

## 2021-01-03 NOTE — Patient Instructions (Addendum)
Medication Instructions:  Your physician recommends that you continue on your current medications as directed. Please refer to the Current Medication list given to you today.  *If you need a refill on your cardiac medications before your next appointment, please call your pharmacy*   Lab Work: Lipid panel and ALT in 3 months If you have labs (blood work) drawn today and your tests are completely normal, you will receive your results only by: Marland Kitchen MyChart Message (if you have MyChart) OR . A paper copy in the mail If you have any lab test that is abnormal or we need to change your treatment, we will call you to review the results.   Testing/Procedures: Referred to Cardiac Rehab    Follow-Up: At [facility], you and your health needs are our priority.  As part of our continuing mission to provide you with exceptional heart care, we have created designated Provider Care Teams.  These Care Teams include your primary Cardiologist (physician) and Advanced Practice Providers (APPs -  Physician Assistants and Nurse Practitioners) who all work together to provide you with the care you need, when you need it.  We recommend signing up for the patient portal called "MyChart".  Sign up information is provided on this After Visit Summary.  MyChart is used to connect with patients for Virtual Visits (Telemedicine).  Patients are able to view lab/test results, encounter notes, upcoming appointments, etc.  Non-urgent messages can be sent to your provider as well.   To learn more about what you can do with MyChart, go to [email or web address].    Your next appointment:   3 month(s)  The format for your next appointment:   In Person  Provider:   Dr Graciela Husbands

## 2021-01-03 NOTE — Progress Notes (Signed)
Cardiology Office Note:    Date:  01/03/2021   ID:  Victor Locketobert W Caloca Jr., DOB 01/23/1946, MRN [id number]  PCP:  [facility]   [facility]  Cardiologist:  Donato SchultzMark Jerian Morais, MD  Advanced Practice Provider:  No care team member to display Electrophysiologist:  None       Referring MD: [facility]    History of Present Illness:    Victor LocketRobert W Stief Jr. is a 75 y.o. male here for the follow-up of coronary artery disease.  Hospitalization 2/22.  In review of H&P:diabetes hypertension hyperlipidemia with prior statin intolerance who presents today to the emergency department with severe crushing substernal chest discomfort starting at about 830 this morning after he woke up.  Gradually started then increased to 10 out of 10 severity.  Felt some radiation across his chest as well into his arms.  No significant shortness of breath or diaphoresis.  After this did not subside, he decided to drive himself to the hospital for further evaluation.  His wife Victor Hutchinson is an Hotel manageremployee of [employer].    His symptoms first started on Wednesday morning at 3:30 AM where he woke up and felt severe crushing chest discomfort that gradually resolved on its own.  He took his blood pressure at the time and it was highly elevated at 240/160 with his heart rate elevated as well at 130.  He took some deep breaths calm down and gradually resolved.  He did not have another episode until earlier this morning.  His sister had heart disease and died after surgery he states.  He has a history of having "bad kidneys ".  Also has a history of diabetes on 2 oral medications.  He is not on Metformin.   His original EKG when presenting to the emergency department at 10:39 AM demonstrates sinus rhythm with right bundle branch block and approximately 1 mm of ST segment elevation in V3 through V6.  On repeat EKG at 1252, segments had resolved but T wave inversions are now present.  Past Medical History:   Diagnosis Date  . Diabetes mellitus without complication (HCC)   . Gout 06/06/2013  . Hyperlipidemia LDL goal <70 06/06/2013  . Hypertension   . Hypothyroidism   . Nephrolithiasis 06/06/2013  . Non-ST elevation (NSTEMI) myocardial infarction [facility](HCC) 12/24/2020    Past Surgical History:  Procedure Laterality Date  . CORONARY/GRAFT ACUTE MI REVASCULARIZATION N/A 12/24/2020   Procedure: Coronary/Graft Acute MI Revascularization;  Surgeon: Lennette BihariKelly, Thomas A, MD;  [facility];  Service: Cardiovascular;  Laterality: N/A;  . LEFT HEART CATH AND CORONARY ANGIOGRAPHY N/A 12/24/2020   Procedure: LEFT HEART CATH AND CORONARY ANGIOGRAPHY;  Surgeon: Lennette BihariKelly, Thomas A, MD;  Location: [facility] INVASIVE CV LAB;  Service: Cardiovascular;  Laterality: N/A;    Current Medications: Current Meds  Medication Sig  . aspirin 81 MG chewable tablet Chew 1 tablet (81 mg total) by mouth daily.  . febuxostat (ULORIC) 40 MG tablet Take 40 mg by mouth daily.  Marland Kitchen. glipiZIDE (GLUCOTROL) 5 MG tablet Take 5 mg by mouth every morning.   Marland Kitchen. levothyroxine (SYNTHROID, LEVOTHROID) 25 MCG tablet Take 1 tablet (25 mcg total) by mouth daily. PATIENT NEEDS OFFICE VISIT FOR ADDITIONAL REFILLS  . metoprolol tartrate (LOPRESSOR) 25 MG tablet Take 0.5 tablets (12.5 mg total) by mouth 2 (two) times daily.  . nitroGLYCERIN (NITROSTAT) 0.4 MG SL tablet Place 1 tablet (0.4 mg total) under the tongue every 5 (five) minutes x 3  doses as needed for chest pain.  . rosuvastatin (CRESTOR) 20 MG tablet Take 1 tablet (20 mg total) by mouth daily.  . ticagrelor (BRILINTA) 90 MG TABS tablet Take 1 tablet (90 mg total) by mouth 2 (two) times daily.     Allergies:   Statins   Social History   Socioeconomic History  . Marital status: Married    Spouse name: Not on file  . Number of children: Not on file  . Years of education: Not on file  . Highest education level: Not on file  Occupational History  . Not on file  Tobacco Use  . Smoking  status: Never Smoker  . Smokeless tobacco: Never Used  Vaping Use  . Vaping Use: Not on file  Substance and Sexual Activity  . Alcohol use: No  . Drug use: No  . Sexual activity: Yes  Other Topics Concern  . Not on file  Social History Narrative  . Not on file   Social Determinants of Health   Financial Resource Strain: Not on file  Food Insecurity: Not on file  Transportation Needs: Not on file  Physical Activity: Not on file  Stress: Not on file  Social Connections: Not on file     Family History: The patient's family history includes Heart disease in his sister.  ROS:   Please see the history of present illness.     All other systems reviewed and are negative.  EKGs/Labs/Other Studies Reviewed:    The following studies were reviewed today:  Cath 12/24/20:  Cath 12/24/2020  Prox RCA lesion is 20% stenosed.  Mid RCA lesion is 20% stenosed.  2nd Mrg lesion is 50% stenosed.  Mid Cx to Dist Cx lesion is 45% stenosed.  Mid LAD lesion is 95% stenosed.  Post intervention, there is a 0% residual stenosis.  There is moderate left ventricular systolic dysfunction.  A stent was successfully placed.  Acute coronary syndrome secondary to subtotal 95% mid LAD stenosis.  Mild -moderate concomitant CAD with 50% ostial OM2 stenosis and diffuse 40 to 50% mid AV groove stenosis; and 20% proximal and mid RCA stenoses.  Moderate LV dysfunction with hypocontractility involving the mid distal anterolateral wall and apex, with EF estimated at 40%. LVEDP 17 mm.  Successful PCI to the mid LAD with ultimate insertion of a 2.5 x 18 mm Resolute Onyx DES stent postdilated to 2.54 mm with the stenosis being reduced to 0%.  RECOMMENDATION: DAPT for minimum of 1 year. Medical therapy for concomitant CAD. Aggressive lipid management with target LDL less than 70 and optimal blood pressure control. With patient awakening from sleep with angina 2 days previously consider future  evaluation for possible obstructive sleep apnea contributing to nocturnal hypoxemia.  Diagnostic Dominance: Right    Intervention       Echo 12/25/2020 1. Left ventricular ejection fraction, by estimation, is 55%. The left  ventricle has low normal function. There is mild hypokinesis of the  mid-to-distal septal, mid-to-distal anterior and distal inferior LV wall  segments. Left ventricular diastolic  parameters are consistent with Grade I diastolic dysfunction (impaired  relaxation).  2. Right ventricular systolic function is normal. The right ventricular  size is normal. There is normal pulmonary artery systolic pressure.  3. The aortic valve is tricuspid. There is mild thickening of the aortic  valve. Aortic valve regurgitation is not visualized.  4. The inferior vena cava is normal in size with greater than 50%  respiratory variability, suggesting right atrial pressure of  3 mmHg.  5. The mitral valve is normal in structure. Trivial mitral valve  regurgitation.     Recent Labs: 12/24/2020: TSH 0.919 12/25/2020: ALT 19 12/26/2020: BUN 23; Creatinine, Ser 1.81; Hemoglobin 13.2; Platelets 144; Potassium 4.9; Sodium 138  Recent Lipid Panel    Component Value Date/Time   CHOL 250 (H) 12/24/2020 1259   TRIG 451 (H) 12/24/2020 1259   HDL 30 (L) 12/24/2020 1259   CHOLHDL 8.3 12/24/2020 1259   VLDL UNABLE TO CALCULATE IF TRIGLYCERIDE OVER 400 mg/dL 09/38/1829 9371   LDLCALC UNABLE TO CALCULATE IF TRIGLYCERIDE OVER 400 mg/dL 69/67/8938 1017   LDLDIRECT 142.4 (H) 12/24/2020 1259     Risk Assessment/Calculations:      Physical Exam:    VS:  BP 140/72 (BP Location: Left Arm, Patient Position: Sitting, Cuff Size: Normal)   Pulse (!) 50   Ht 5\' 10"  (1.778 m)   Wt 210 lb (95.3 kg)   SpO2 98%   BMI 30.13 kg/m     Wt Readings from Last 3 Encounters:  01/03/21 210 lb (95.3 kg)  12/26/20 212 lb (96.2 kg)  10/09/20 216 lb (98 kg)     GEN:  Well nourished, well  developed in no acute distress HEENT: Normal NECK: No JVD; No carotid bruits LYMPHATICS: No lymphadenopathy CARDIAC: RRR, no murmurs, rubs, gallops RESPIRATORY:  Clear to auscultation without rales, wheezing or rhonchi  ABDOMEN: Soft, non-tender, non-distended MUSCULOSKELETAL:  No edema; No deformity  SKIN: Warm and dry NEUROLOGIC:  Alert and oriented x 3 PSYCHIATRIC:  Normal affect   ASSESSMENT:    1. Essential hypertension, benign   2. Non-ST elevation (NSTEMI) myocardial infarction (HCC)   3. Medication management    PLAN:    In order of problems listed above:  Non-ST elevation myocardial infarction Coronary artery disease -95% mid LAD stent.  Dual antiplatelet therapy for 1 year.  High intensity statin.  Seems to be tolerating well.  No myalgias. -Goal-directed medical therapy -Cardiac rehab.  Hyperlipidemia -High intensity statin.  In the past he had some trouble with statin therapy but now seems to be doing quite well.  Repeat lipid panel in 3 months.  LDL 142 creatinine 1.8  Type 2 diabetes -Hemoglobin A1c 6.9.  He did not tolerate Metformin because of kidney function.  Consider SGLT2 inhibitor.  [facility].  He currently is on glipizide 5 mg every morning.    Medication Adjustments/Labs and Tests Ordered: Current medicines are reviewed at length with the patient today.  Concerns regarding medicines are outlined above.  Orders Placed This Encounter  Procedures  . Lipid panel  . ALT  . AMB referral to cardiac rehabilitation   No orders of the defined types were placed in this encounter.   Patient Instructions  Medication Instructions:  Your physician recommends that you continue on your current medications as directed. Please refer to the Current Medication list given to you today.  *If you need a refill on your cardiac medications before your next appointment, please call your  pharmacy*   Lab Work: Lipid panel and ALT in 3 months If you have labs (blood work) drawn today and your tests are completely normal, you will receive your results only by: 10/11/20 MyChart Message (if you have MyChart) OR . A paper copy in the mail If you have any lab test that is abnormal or we need to change your treatment, we will  call you to review the results.   Testing/Procedures: Referred to Cardiac Rehab    Follow-Up: At [facility], you and your health needs are our priority.  As part of our continuing mission to provide you with exceptional heart care, we have created designated Provider Care Teams.  These Care Teams include your primary Cardiologist (physician) and Advanced Practice Providers (APPs -  Physician Assistants and Nurse Practitioners) who all work together to provide you with the care you need, when you need it.  We recommend signing up for the patient portal called "MyChart".  Sign up information is provided on this After Visit Summary.  MyChart is used to connect with patients for Virtual Visits (Telemedicine).  Patients are able to view lab/test results, encounter notes, upcoming appointments, etc.  Non-urgent messages can be sent to your provider as well.   To learn more about what you can do with MyChart, go to [email or web address].    Your next appointment:   3 month(s)  The format for your next appointment:   In Person  Provider:   Dr Graciela Husbands       Signed, Donato Schultz, MD  01/03/2021 5:43 PM    [facility]

## 2021-01-04 ENCOUNTER — Other Ambulatory Visit: Payer: Self-pay | Admitting: *Deleted

## 2021-01-04 LAB — BASIC METABOLIC PANEL
BUN/Creatinine Ratio: 20 (ref 10–24)
BUN: 39 mg/dL — ABNORMAL HIGH (ref 8–27)
CO2: 17 mmol/L — ABNORMAL LOW (ref 20–29)
Calcium: 10.2 mg/dL (ref 8.6–10.2)
Chloride: 105 mmol/L (ref 96–106)
Creatinine, Ser: 1.97 mg/dL — ABNORMAL HIGH (ref 0.76–1.27)
GFR calc Af Amer: 37 mL/min/{1.73_m2} — ABNORMAL LOW (ref >59)
GFR calc non Af Amer: 32 mL/min/{1.73_m2} — ABNORMAL LOW (ref >59)
Glucose: 154 mg/dL — ABNORMAL HIGH (ref 65–99)
Potassium: 4.6 mmol/L (ref 3.5–5.2)
Sodium: 139 mmol/L (ref 134–144)

## 2021-01-04 NOTE — Patient Outreach (Signed)
[facility] Customer service manager [facility]) Care Management  01/04/2021  Daryan Buell. 05-14-1946 [id number]   EMMI-GENERAL DISCHARGE-UNSUCCESSFUL RED ON EMMI ALERT Day #1 Date: 2/15 Red Alert Reason:NO FOLLOW UP SCHEDULE  OUTREACH #2 RN attempted another outreach call however remains unsuccessful. Note able to leave a message on the pt's # but left a HIPAA approved voice message on another contact number for [employer].  Will awaiting a call back and rescheduled another outreach call over the next week.  Elliot Cousin, RN Care Management Coordinator [facility] Main Office [phone]

## 2021-01-04 NOTE — Progress Notes (Signed)
Creatinine mildly elevated from previous 1.81 to 1.97 during last hospitalization. Patient is not on any medication that can change kidney function. Recommend close follow up with nephrology service. Please check with the patient to see if he has a nephrologist. Will also forward to Dr. Anne Fu who saw the patient yesterday

## 2021-01-11 ENCOUNTER — Other Ambulatory Visit: Payer: Self-pay | Admitting: *Deleted

## 2021-01-11 NOTE — Patient Outreach (Signed)
[facility] Customer service manager [facility]) Care Management  01/11/2021  Victor Hutchinson. 25-Jun-1946 [id number]   EMMI-GENERAL DISCHARGE-UNSUCCESSFUL RED ON EMMI ALERT Day #1 Date:12/28/2020 Red Alert Reason:NO FOLLOW UP APPOINTMENT  OUTREACH #3 RN  Attempted another outreach however unsuccessful and unable to leave a HIPAA message.  Will rescheduled another outreach in a few weeks for pending needs.  Elliot Cousin, RN Care Management Coordinator [facility] Main Office [phone]

## 2021-01-27 ENCOUNTER — Encounter (HOSPITAL_COMMUNITY): Payer: Self-pay

## 2021-01-27 ENCOUNTER — Telehealth (HOSPITAL_COMMUNITY): Payer: Self-pay

## 2021-01-27 NOTE — Telephone Encounter (Signed)
Attempted to call patient in regards to Cardiac Rehab - unable to leave VM, VM box full. °  °Mailed letter °

## 2021-02-10 ENCOUNTER — Telehealth (HOSPITAL_COMMUNITY): Payer: Self-pay

## 2021-02-10 NOTE — Telephone Encounter (Signed)
No response from pt.  Closed referral  

## 2021-02-11 ENCOUNTER — Other Ambulatory Visit: Payer: Self-pay | Admitting: *Deleted

## 2021-02-11 NOTE — Patient Outreach (Signed)
[facility] Customer service manager [facility]) Care Management  02/11/2021  [name]. 17-Jun-1946 [id number]  EMMI-GENERAL DISCHARGE-UNSUCCESSFUL-CLOSED RED ON EMMI ALERT Day #1 Date:12/28/2020 Red Alert Reason:NO FOLLOW UP APPOINTMENT  OUTREACH #4 RN attempted another outreach however unsuccessful. RN able to leave a HIPAA approved voice message requesting a call back.  Will close this case and notify the provider of pt's disposition with [facility] services.  Elliot Cousin, RN Care Management Coordinator [facility] Main Office [phone]

## 2021-02-25 ENCOUNTER — Encounter: Payer: Self-pay | Admitting: Cardiovascular Disease

## 2021-02-25 ENCOUNTER — Other Ambulatory Visit: Payer: Self-pay

## 2021-02-25 ENCOUNTER — Telehealth: Payer: Self-pay | Admitting: Cardiology

## 2021-02-25 ENCOUNTER — Other Ambulatory Visit (HOSPITAL_COMMUNITY): Payer: Self-pay

## 2021-02-25 ENCOUNTER — Ambulatory Visit: Payer: Medicare HMO | Admitting: Cardiovascular Disease

## 2021-02-25 VITALS — BP 154/96 | HR 66 | Ht 70.0 in | Wt 203.0 lb

## 2021-02-25 DIAGNOSIS — R079 Chest pain, unspecified: Secondary | ICD-10-CM

## 2021-02-25 DIAGNOSIS — I214 Non-ST elevation (NSTEMI) myocardial infarction: Secondary | ICD-10-CM | POA: Diagnosis not present

## 2021-02-25 DIAGNOSIS — I249 Acute ischemic heart disease, unspecified: Secondary | ICD-10-CM

## 2021-02-25 MED ORDER — ISOSORBIDE MONONITRATE ER 30 MG PO TB24: 30.0000 mg | ORAL_TABLET | Freq: Every day | ORAL | 3 refills | Status: DC

## 2021-02-25 MED ORDER — AMLODIPINE BESYLATE 5 MG PO TABS: 5.0000 mg | ORAL_TABLET | Freq: Every day | ORAL | 3 refills | Status: DC

## 2021-02-25 NOTE — Telephone Encounter (Signed)
Pt c/o BP issue: STAT if pt c/o blurred vision, one-sided weakness or slurred speech  1. What are your last 5 BP readings?  02/24/21: 220/110 HR 113  02/25/21: 125/90 HR 80  2. Are you having any other symptoms (ex. Dizziness, headache, blurred vision, passed out)? Headache, lightheaded   3. What is your BP issue? Pt is concerned about how high his BP and HR were last night.   Patient took a Nitro for the first time last night. This seemed to help a little but it is still higher than normal. He is not sure what to do.

## 2021-02-25 NOTE — Progress Notes (Signed)
Cardiology Office Note:    Date:  02/25/2021   ID:  Victor Hutchinson., DOB 02/03/1946, MRN [id number]  [facility]  [facility]   [facility]  Cardiologist:  Donato Schultz, MD  Advanced Practice Provider:  No care team member to display Electrophysiologist:  None     Referring MD: [facility]   Chief Complaint  Patient presents with  . Chest Pain  . Coronary Artery Disease     February 25, 2021   Victor Hutchinson. is a 75 y.o. male with a hx of coronary artery disease, hyperlipidemia, diabetes mellitus.  He typically sees Dr. Anne Fu.  He presents today as a work in visit for episodes of chest discomfort. Last night , started having CP  2 out of 10  Same type of CP  Started R upper chest , migrated to center of chest . Finally took 3 NTG.  Gradually resolved  Avoids salt  Feels better today , is anxious but no further CP  Has not missed any doses of ASA or Brilinta    Past Medical History:  Diagnosis Date  . Diabetes mellitus without complication (HCC)   . Gout 06/06/2013  . Hyperlipidemia LDL goal <70 06/06/2013  . Hypertension   . Hypothyroidism   . Nephrolithiasis 06/06/2013  . Non-ST elevation (NSTEMI) myocardial infarction [facility]) 12/24/2020    Past Surgical History:  Procedure Laterality Date  . CORONARY/GRAFT ACUTE MI REVASCULARIZATION N/A 12/24/2020   Procedure: Coronary/Graft Acute MI Revascularization;  Surgeon: Lennette Bihari, MD;  Location: [facility] INVASIVE CV LAB;  Service: Cardiovascular;  Laterality: N/A;  . LEFT HEART CATH AND CORONARY ANGIOGRAPHY N/A 12/24/2020   Procedure: LEFT HEART CATH AND CORONARY ANGIOGRAPHY;  Surgeon: Lennette Bihari, MD;  Location: [facility] INVASIVE CV LAB;  Service: Cardiovascular;  Laterality: N/A;    Current Medications: Current Meds  Medication Sig  . Alogliptin Benzoate 12.5 MG TABS Take 12.5 mg by mouth daily.  Marland Kitchen amLODipine (NORVASC) 5 MG tablet Take 1 tablet (5 mg total) by mouth daily.  Marland Kitchen aspirin  81 MG chewable tablet Chew 1 tablet (81 mg total) by mouth daily.  . febuxostat (ULORIC) 40 MG tablet Take 40 mg by mouth daily.  Marland Kitchen glipiZIDE (GLUCOTROL) 5 MG tablet Take 5 mg by mouth every morning.   . isosorbide mononitrate (IMDUR) 30 MG 24 hr tablet Take 1 tablet (30 mg total) by mouth daily.  Marland Kitchen levothyroxine (SYNTHROID) 150 MCG tablet Take 150 mcg by mouth daily before breakfast.  . metoprolol tartrate (LOPRESSOR) 25 MG tablet Take 0.5 tablets (12.5 mg total) by mouth 2 (two) times daily.  . nitroGLYCERIN (NITROSTAT) 0.4 MG SL tablet Place 1 tablet (0.4 mg total) under the tongue every 5 (five) minutes x 3 doses as needed for chest pain.  . pioglitazone (ACTOS) 30 MG tablet Take 30 mg by mouth daily.  . rosuvastatin (CRESTOR) 20 MG tablet Take 1 tablet (20 mg total) by mouth daily.  . ticagrelor (BRILINTA) 90 MG TABS tablet Take 1 tablet (90 mg total) by mouth 2 (two) times daily.     Allergies:   Statins   Social History   Socioeconomic History  . Marital status: Married    Spouse name: Not on file  . Number of children: Not on file  . Years of education: Not on file  . Highest education level: Not on file  Occupational History  . Not on file  Tobacco Use  . Smoking status:  Never Smoker  . Smokeless tobacco: Never Used  Vaping Use  . Vaping Use: Not on file  Substance and Sexual Activity  . Alcohol use: No  . Drug use: No  . Sexual activity: Yes  Other Topics Concern  . Not on file  Social History Narrative  . Not on file   Social Determinants of Health   Financial Resource Strain: Not on file  Food Insecurity: Not on file  Transportation Needs: Not on file  Physical Activity: Not on file  Stress: Not on file  Social Connections: Not on file     Family History: The patient's family history includes Heart disease in his sister.  ROS:   Please see the history of present illness.     All other systems reviewed and are negative.  EKGs/Labs/Other Studies  Reviewed:    The following studies were reviewed today:   EKG:   February 25, 2021: Normal sinus rhythm, first-degree AV block, right bundle branch block no changes from previous EKG.  Recent Labs: 12/24/2020: TSH 0.919 12/25/2020: ALT 19 12/26/2020: Hemoglobin 13.2; Platelets 144 01/03/2021: BUN 39; Creatinine, Ser 1.97; Potassium 4.6; Sodium 139  Recent Lipid Panel    Component Value Date/Time   CHOL 250 (H) 12/24/2020 1259   TRIG 451 (H) 12/24/2020 1259   HDL 30 (L) 12/24/2020 1259   CHOLHDL 8.3 12/24/2020 1259   VLDL UNABLE TO CALCULATE IF TRIGLYCERIDE OVER 400 mg/dL 35/32/9924 2683   LDLCALC UNABLE TO CALCULATE IF TRIGLYCERIDE OVER 400 mg/dL 41/96/2229 7989   LDLDIRECT 142.4 (H) 12/24/2020 1259     Risk Assessment/Calculations:       Physical Exam:    VS:  BP (!) 154/96   Pulse 66   Ht 5\' 10"  (1.778 m)   Wt 203 lb (92.1 kg)   SpO2 98%   BMI 29.13 kg/m     Wt Readings from Last 3 Encounters:  02/25/21 203 lb (92.1 kg)  01/03/21 210 lb (95.3 kg)  12/26/20 212 lb (96.2 kg)     GEN:  Well nourished, well developed in no acute distress HEENT: Normal NECK: No JVD; No carotid bruits LYMPHATICS: No lymphadenopathy CARDIAC: RRR, no murmurs, rubs, gallops RESPIRATORY:  Clear to auscultation without rales, wheezing or rhonchi  ABDOMEN: Soft, non-tender, non-distended MUSCULOSKELETAL:  No edema; No deformity  SKIN: Warm and dry NEUROLOGIC:  Alert and oriented x 3 PSYCHIATRIC:  Normal affect   ASSESSMENT:    1. Chest pain of uncertain etiology    PLAN:      Unstable angina: Hason presents with an episode of chest pain that it is very similar to his presenting episode of pain when he had his MI.  It was not as severe.  It was associated with marked hypertension.  It is possible that this episode was demand ischemia in the setting of marked hypertension.  It is also possible that he has an edge dissection with his previously placed mid LAD stent. He is pain-free at  present.  Given the fact that his creatinine is 1.8, I would like to avoid cath unless we absolutely need to do it.  We will start him on amlodipine 5 mg a day and isosorbide 30 mg a day.  He was previously on amlodipine but this was discontinued when the heart catheterization showed an ejection fraction of 40%.  The subsequent echo performed the next day showed an EF of 55%.  At this point I think that it is quite likely that his ejection fraction  is overall well-preserved and I think he will tolerate amlodipine.  The other alternative would be to give him hydralazine and isosorbide.  If he has any further episodes of chest pain I encouraged him to take nitroglycerin right away.  If the chest pain does not resolve very quickly then he is to head to the emergency room.  He will follow up with Dr. Anne Fu on May 23.  If he is continue to have any sort of chest discomfort he will need to have a repeat heart catheterization.  He and his wife understand the plan.        Medication Adjustments/Labs and Tests Ordered: Current medicines are reviewed at length with the patient today.  Concerns regarding medicines are outlined above.  Orders Placed This Encounter  Procedures  . EKG 12-Lead   Meds ordered this encounter  Medications  . amLODipine (NORVASC) 5 MG tablet    Sig: Take 1 tablet (5 mg total) by mouth daily.    Dispense:  90 tablet    Refill:  3  . isosorbide mononitrate (IMDUR) 30 MG 24 hr tablet    Sig: Take 1 tablet (30 mg total) by mouth daily.    Dispense:  90 tablet    Refill:  3    Patient Instructions  Medication Instructions:  Your physician has recommended you make the following change in your medication:   START Amlodipine 5mg  daily START Imdur 30mg  daily   *If you need a refill on your cardiac medications before your next appointment, please call your pharmacy*   Lab Work: none    Testing/Procedures: none   Follow-Up: At , you and your health  needs are our priority.  As part of our continuing mission to provide you with exceptional heart care, we have created designated Provider Care Teams.  These Care Teams include your primary Cardiologist (physician) and Advanced Practice Providers (APPs -  Physician Assistants and Nurse Practitioners) who all work together to provide you with the care you need, when you need it.  Your next appointment:    May 23rd   The format for your next appointment:   In Person  Provider:   [employer], MD      Signed, May 25, MD  02/25/2021 5:13 PM    [facility]

## 2021-02-25 NOTE — Telephone Encounter (Signed)
Spoke with pt who reports chest pain last night similar to pain he experienced with MI 12/2020.  Pt also reports elevated BP and HR last night.  Pain resolved after 3 nitro tablets.  Pt denies current CP, SOB, nausea vomiting or diaphoresis.  Reports some mild dizziness/lightheadedness. Current BP 149/100 with HR of 67.  Pt has taken his morning medications. Appointment scheduled with Dr Elease Hashimoto, DOD today at 3:20.  Dr Harvie Bridge RN notified.  Reviewed ED precautions and advised pt if he develops the slightest CP he should call 911.  Pt verbalizes understanding and agrees with current plan

## 2021-02-25 NOTE — Patient Instructions (Signed)
Medication Instructions:  Your physician has recommended you make the following change in your medication:   START Amlodipine 5mg  daily START Imdur 30mg  daily   *If you need a refill on your cardiac medications before your next appointment, please call your pharmacy*   Lab Work: none    Testing/Procedures: none   Follow-Up: At , you and your health needs are our priority.  As part of our continuing mission to provide you with exceptional heart care, we have created designated Provider Care Teams.  These Care Teams include your primary Cardiologist (physician) and Advanced Practice Providers (APPs -  Physician Assistants and Nurse Practitioners) who all work together to provide you with the care you need, when you need it.  Your next appointment:    May 23rd   The format for your next appointment:   In Person  Provider:   [employer], MD

## 2021-03-16 ENCOUNTER — Other Ambulatory Visit (HOSPITAL_COMMUNITY): Payer: Self-pay

## 2021-03-25 ENCOUNTER — Other Ambulatory Visit: Payer: Medicare HMO | Admitting: *Deleted

## 2021-03-25 ENCOUNTER — Other Ambulatory Visit: Payer: Self-pay

## 2021-03-25 DIAGNOSIS — I10 Essential (primary) hypertension: Secondary | ICD-10-CM | POA: Diagnosis not present

## 2021-03-25 DIAGNOSIS — I214 Non-ST elevation (NSTEMI) myocardial infarction: Secondary | ICD-10-CM | POA: Diagnosis not present

## 2021-03-25 DIAGNOSIS — Z79899 Other long term (current) drug therapy: Secondary | ICD-10-CM

## 2021-03-26 LAB — LIPID PANEL
Chol/HDL Ratio: 5.7 ratio — ABNORMAL HIGH (ref 0.0–5.0)
Cholesterol, Total: 136 mg/dL (ref 100–199)
HDL: 24 mg/dL — ABNORMAL LOW (ref >39)
LDL Chol Calc (NIH): 67 mg/dL (ref 0–99)
Triglycerides: 280 mg/dL — ABNORMAL HIGH (ref 0–149)
VLDL Cholesterol Cal: 45 mg/dL — ABNORMAL HIGH (ref 5–40)

## 2021-03-26 LAB — ALT: ALT: 14 IU/L (ref 0–44)

## 2021-03-30 ENCOUNTER — Other Ambulatory Visit: Payer: Medicare HMO

## 2021-03-30 ENCOUNTER — Encounter: Payer: Self-pay | Admitting: *Deleted

## 2021-04-04 ENCOUNTER — Encounter: Payer: Self-pay | Admitting: Cardiology

## 2021-04-04 ENCOUNTER — Ambulatory Visit: Payer: Medicare HMO | Admitting: Cardiology

## 2021-04-04 ENCOUNTER — Other Ambulatory Visit: Payer: Self-pay

## 2021-04-04 VITALS — BP 140/70 | HR 62 | Ht 70.0 in | Wt 199.0 lb

## 2021-04-04 DIAGNOSIS — E785 Hyperlipidemia, unspecified: Secondary | ICD-10-CM | POA: Diagnosis not present

## 2021-04-04 DIAGNOSIS — I2583 Coronary atherosclerosis due to lipid rich plaque: Secondary | ICD-10-CM

## 2021-04-04 DIAGNOSIS — Z789 Other specified health status: Secondary | ICD-10-CM

## 2021-04-04 DIAGNOSIS — Z8249 Family history of ischemic heart disease and other diseases of the circulatory system: Secondary | ICD-10-CM | POA: Diagnosis not present

## 2021-04-04 DIAGNOSIS — I2510 Atherosclerotic heart disease of native coronary artery without angina pectoris: Secondary | ICD-10-CM | POA: Diagnosis not present

## 2021-04-04 NOTE — Progress Notes (Signed)
Cardiology Office Note:    Date:  04/04/2021   ID:  Victor Locket., DOB 1946-02-25, MRN [id number]  PCP:  [facility]   [facility] Providers Cardiologist:  Donato Schultz, MD     Referring MD: [facility]    History of Present Illness:    Victor Mauch. is a 75 y.o. male here for follow-up of coronary artery disease hyperlipidemia and diabetes.  He was worked in on 02/25/2021 by Dr. Elease Hashimoto during a DOD day for episodes of chest discomfort.  He had one in the middle of the night 2 out of 10.  Started in the right upper chest and migrated to the center.  Took 3 nitroglycerin and it resolved.  Has not missed any doses of aspirin or Brilinta.  He had left heart catheterization on 12/24/2020.  Past Medical History:  Diagnosis Date  . Diabetes mellitus without complication (HCC)   . Gout 06/06/2013  . Hyperlipidemia LDL goal <70 06/06/2013  . Hypertension   . Hypothyroidism   . Nephrolithiasis 06/06/2013  . Non-ST elevation (NSTEMI) myocardial infarction [facility]) 12/24/2020    Past Surgical History:  Procedure Laterality Date  . CORONARY/GRAFT ACUTE MI REVASCULARIZATION N/A 12/24/2020   Procedure: Coronary/Graft Acute MI Revascularization;  Surgeon: Lennette Bihari, MD;  Location: [facility] INVASIVE CV LAB;  Service: Cardiovascular;  Laterality: N/A;  . LEFT HEART CATH AND CORONARY ANGIOGRAPHY N/A 12/24/2020   Procedure: LEFT HEART CATH AND CORONARY ANGIOGRAPHY;  Surgeon: Lennette Bihari, MD;  Location: [facility] INVASIVE CV LAB;  Service: Cardiovascular;  Laterality: N/A;    Current Medications: Current Meds  Medication Sig  . Alogliptin Benzoate 12.5 MG TABS Take 12.5 mg by mouth daily.  Marland Kitchen amLODipine (NORVASC) 5 MG tablet Take 1 tablet (5 mg total) by mouth daily.  Marland Kitchen aspirin 81 MG chewable tablet Chew 1 tablet (81 mg total) by mouth daily.  . febuxostat (ULORIC) 40 MG tablet Take 40 mg by mouth daily.  Marland Kitchen glipiZIDE (GLUCOTROL) 5 MG tablet Take 5 mg by mouth every morning.    . isosorbide mononitrate (IMDUR) 30 MG 24 hr tablet Take 1 tablet (30 mg total) by mouth daily.  Marland Kitchen levothyroxine (SYNTHROID) 150 MCG tablet Take 150 mcg by mouth daily before breakfast.  . metoprolol tartrate (LOPRESSOR) 25 MG tablet Take 0.5 tablets (12.5 mg total) by mouth 2 (two) times daily.  . nitroGLYCERIN (NITROSTAT) 0.4 MG SL tablet Place 1 tablet (0.4 mg total) under the tongue every 5 (five) minutes x 3 doses as needed for chest pain.  . pioglitazone (ACTOS) 30 MG tablet Take 30 mg by mouth daily.  . ticagrelor (BRILINTA) 90 MG TABS tablet Take 1 tablet (90 mg total) by mouth 2 (two) times daily.     Allergies:   Statins   Social History   Socioeconomic History  . Marital status: Married    Spouse name: Not on file  . Number of children: Not on file  . Years of education: Not on file  . Highest education level: Not on file  Occupational History  . Not on file  Tobacco Use  . Smoking status: Never Smoker  . Smokeless tobacco: Never Used  Vaping Use  . Vaping Use: Never used  Substance and Sexual Activity  . Alcohol use: No  . Drug use: No  . Sexual activity: Yes  Other Topics Concern  . Not on file  Social History Narrative  . Not on file   Social Determinants of  Health   Financial Resource Strain: Not on file  Food Insecurity: Not on file  Transportation Needs: Not on file  Physical Activity: Not on file  Stress: Not on file  Social Connections: Not on file     Family History: The patient's family history includes Heart disease in his sister.  ROS:   Please see the history of present illness.     All other systems reviewed and are negative.  EKGs/Labs/Other Studies Reviewed:    The following studies were reviewed today:    Prox RCA lesion is 20% stenosed.  Mid RCA lesion is 20% stenosed.  2nd Mrg lesion is 50% stenosed.  Mid Cx to Dist Cx lesion is 45% stenosed.  Mid LAD lesion is 95% stenosed.  Post intervention, there is a 0% residual  stenosis.  There is moderate left ventricular systolic dysfunction.  A stent was successfully placed.   Acute coronary syndrome secondary to subtotal 95% mid LAD stenosis.  Mild -moderate concomitant CAD with 50% ostial OM2  stenosis and diffuse 40 to 50% mid AV groove stenosis; and 20% proximal and mid RCA stenoses.  Moderate LV dysfunction with hypocontractility involving the mid distal anterolateral wall and apex, with EF estimated at 40%.  LVEDP 17 mm.  Successful PCI to the mid LAD with ultimate insertion of a 2.5 x 18 mm Resolute Onyx DES stent postdilated to 2.54 mm with the stenosis being reduced to 0%.  RECOMMENDATION: DAPT for minimum of 1 year.  Medical therapy for concomitant CAD.  Aggressive lipid management with target LDL less than 70 and optimal blood pressure control.  With patient awakening from sleep with angina 2 days previously consider future evaluation for possible obstructive sleep apnea contributing to nocturnal hypoxemia.  Diagnostic Dominance: Right    Intervention        Recent Labs: 12/24/2020: TSH 0.919 12/26/2020: Hemoglobin 13.2; Platelets 144 01/03/2021: BUN 39; Creatinine, Ser 1.97; Potassium 4.6; Sodium 139 03/25/2021: ALT 14  Recent Lipid Panel    Component Value Date/Time   CHOL 136 03/25/2021 1145   TRIG 280 (H) 03/25/2021 1145   HDL 24 (L) 03/25/2021 1145   CHOLHDL 5.7 (H) 03/25/2021 1145   CHOLHDL 8.3 12/24/2020 1259   VLDL UNABLE TO CALCULATE IF TRIGLYCERIDE OVER 400 mg/dL 24/26/8341 9622   LDLCALC 67 03/25/2021 1145   LDLDIRECT 142.4 (H) 12/24/2020 1259     Risk Assessment/Calculations:      Physical Exam:    VS:  BP 140/70 (BP Location: Left Arm, Patient Position: Sitting, Cuff Size: Normal)   Pulse 62   Ht 5\' 10"  (1.778 m)   Wt 199 lb (90.3 kg)   SpO2 96%   BMI 28.55 kg/m     Wt Readings from Last 3 Encounters:  04/04/21 199 lb (90.3 kg)  02/25/21 203 lb (92.1 kg)  01/03/21 210 lb (95.3 kg)     GEN:  Well  nourished, well developed in no acute distress HEENT: Normal NECK: No JVD; No carotid bruits LYMPHATICS: No lymphadenopathy CARDIAC: RRR, no murmurs, rubs, gallops RESPIRATORY:  Clear to auscultation without rales, wheezing or rhonchi  ABDOMEN: Soft, non-tender, non-distended MUSCULOSKELETAL:  No edema; No deformity  SKIN: Warm and dry NEUROLOGIC:  Alert and oriented x 3 PSYCHIATRIC:  Normal affect   ASSESSMENT:    1. Coronary artery disease due to lipid rich plaque   2. Hyperlipidemia LDL goal <70   3. Family history of carotid artery stenosis   4. Statin intolerance    PLAN:  In order of problems listed above:  Chest pain - This has resolved.  Reassuring.  He feels much better now.  More energy than he had before.  Coronary artery disease - Successful PCI to the mid LAD on 12/24/2020.  Continue with dual antiplatelet therapy.  Aspirin, Brilinta 90 mg twice daily.  Aspirin 81 mg a day.  Continue with current medical management.  Hyperlipidemia --Crestor caused HA. Tried them all, statins that is. Bones hurt. Sister like him, had statin intolerance. Changes in diet.  - Given his underlying coronary artery disease, I like for him to sit with the lipid clinic.  Perhaps PCSK9 inhibitor for him. - LAD 67 triglycerides 280 hemoglobin A1c 6.9 hemoglobin 13.2 creatinine 1.97  Family history of carotid artery disease - Both his brother and mother have had carotid endarterectomies.  I would like to go ahead and check carotid duplex given his underlying CAD and family history.  43-month follow-up     Medication Adjustments/Labs and Tests Ordered: Current medicines are reviewed at length with the patient today.  Concerns regarding medicines are outlined above.  Orders Placed This Encounter  Procedures  . AMB Referral to Heartcare Pharm-D  . VAS US CAROTID   No orders of the defined types were placed in this encounter.   Patient Instructions  Medication Instructions:  The  current medical regimen is effective;  continue present plan and medications.  *If you need a refill on your cardiac medications before your next appointment, please call your pharmacy*  Testing/Procedures: Your physician has requested that you have a carotid duplex. This test is an ultrasound of the carotid arteries in your neck. It looks at blood flow through these arteries that supply the brain with blood. Allow one hour for this exam. There are no restrictions or special instructions.  You have been referred to our Lipid Clinic located here at [facility].  Follow-Up: At [facility], you and your health needs are our priority.  As part of our continuing mission to provide you with exceptional heart care, we have created designated Provider Care Teams.  These Care Teams include your primary Cardiologist (physician) and Advanced Practice Providers (APPs -  Physician Assistants and Nurse Practitioners) who all work together to provide you with the care you need, when you need it.  We recommend signing up for the patient portal called "MyChart".  Sign up information is provided on this After Visit Summary.  MyChart is used to connect with patients for Virtual Visits (Telemedicine).  Patients are able to view lab/test results, encounter notes, upcoming appointments, etc.  Non-urgent messages can be sent to your provider as well.   To learn more about what you can do with MyChart, go to [email or web address].    Your next appointment:   6 month(s)  The format for your next appointment:   In Person  Provider:   Donato Schultz, MD  Thank you for choosing [facility]!!        Signed, Donato Schultz, MD  04/04/2021 3:07 PM    [facility]

## 2021-04-04 NOTE — Patient Instructions (Addendum)
Medication Instructions:  The current medical regimen is effective;  continue present plan and medications.  *If you need a refill on your cardiac medications before your next appointment, please call your pharmacy*  Testing/Procedures: Your physician has requested that you have a carotid duplex. This test is an ultrasound of the carotid arteries in your neck. It looks at blood flow through these arteries that supply the brain with blood. Allow one hour for this exam. There are no restrictions or special instructions.  You have been referred to our Lipid Clinic located here at [facility].  Follow-Up: At [facility], you and your health needs are our priority.  As part of our continuing mission to provide you with exceptional heart care, we have created designated Provider Care Teams.  These Care Teams include your primary Cardiologist (physician) and Advanced Practice Providers (APPs -  Physician Assistants and Nurse Practitioners) who all work together to provide you with the care you need, when you need it.  We recommend signing up for the patient portal called "MyChart".  Sign up information is provided on this After Visit Summary.  MyChart is used to connect with patients for Virtual Visits (Telemedicine).  Patients are able to view lab/test results, encounter notes, upcoming appointments, etc.  Non-urgent messages can be sent to your provider as well.   To learn more about what you can do with MyChart, go to [email or web address].    Your next appointment:   6 month(s)  The format for your next appointment:   In Person  Provider:   Donato Schultz, MD  Thank you for choosing [facility]!!

## 2021-04-14 ENCOUNTER — Other Ambulatory Visit: Payer: Self-pay | Admitting: Cardiology

## 2021-04-14 ENCOUNTER — Ambulatory Visit (HOSPITAL_COMMUNITY)
Admission: RE | Admit: 2021-04-14 | Discharge: 2021-04-14 | Disposition: A | Discharge status: Home / Self Care | Payer: Medicare HMO | Source: Ambulatory Visit | Attending: Cardiology | Admitting: Cardiology

## 2021-04-14 ENCOUNTER — Other Ambulatory Visit: Payer: Self-pay

## 2021-04-14 DIAGNOSIS — I2583 Coronary atherosclerosis due to lipid rich plaque: Secondary | ICD-10-CM | POA: Insufficient documentation

## 2021-04-14 DIAGNOSIS — I6523 Occlusion and stenosis of bilateral carotid arteries: Secondary | ICD-10-CM | POA: Insufficient documentation

## 2021-04-14 DIAGNOSIS — I2510 Atherosclerotic heart disease of native coronary artery without angina pectoris: Secondary | ICD-10-CM | POA: Insufficient documentation

## 2021-04-14 DIAGNOSIS — E119 Type 2 diabetes mellitus without complications: Secondary | ICD-10-CM | POA: Insufficient documentation

## 2021-04-14 DIAGNOSIS — I10 Essential (primary) hypertension: Secondary | ICD-10-CM | POA: Insufficient documentation

## 2021-04-14 DIAGNOSIS — E785 Hyperlipidemia, unspecified: Secondary | ICD-10-CM | POA: Insufficient documentation

## 2021-04-14 DIAGNOSIS — Z8249 Family history of ischemic heart disease and other diseases of the circulatory system: Secondary | ICD-10-CM | POA: Diagnosis not present

## 2021-04-14 DIAGNOSIS — Z87891 Personal history of nicotine dependence: Secondary | ICD-10-CM | POA: Insufficient documentation

## 2021-04-14 DIAGNOSIS — I252 Old myocardial infarction: Secondary | ICD-10-CM | POA: Insufficient documentation

## 2021-04-28 ENCOUNTER — Ambulatory Visit (INDEPENDENT_AMBULATORY_CARE_PROVIDER_SITE_OTHER): Payer: Medicare HMO | Admitting: Pharmacist

## 2021-04-28 ENCOUNTER — Other Ambulatory Visit: Payer: Self-pay

## 2021-04-28 DIAGNOSIS — E785 Hyperlipidemia, unspecified: Secondary | ICD-10-CM

## 2021-04-28 NOTE — Patient Instructions (Addendum)
Your LDL cholesterol goal is < 55  Your triglyceride goal is < 150  Restart rosuvastatin (Crestor) 20mg  - 1 tablet at night  Call Rocco Kerkhoff, Pharmacist if your headache returns, and we can try pravastatin instead. [phone]  Recheck fasting cholesterol on Thursday, July 28th any time after 7:30am

## 2021-04-28 NOTE — Progress Notes (Signed)
Patient ID: Victor Hutchinson.                 DOB: 26-Jun-1946                    MRN: [id number]     HPI: Victor Hutchinson. is a pleasant 75 y.o. male patient referred to lipid clinic by Dr Anne Fu. PMH is significant for CAD, HLD, DM, gout, and hypothyroidism. Pt presented with NSTEMI 12/24/20. LHC showed 95% mid LAD stenosis which was treated with PCI. Echo 12/25/20 showed LVEF 55%. Seen 4/15 for chest pain accompanied by HTN. Felt to be demand ischemia in setting of marked hypertension, edge dissection with previously placed stent also possible, however plan was to avoid cath unless absolutely necessary due to SCr of 1.8. He was started on amlodipine 5mg  daily and Imdur 30mg  daily.   Pt presents today in good spirits. Reports taking Lipitor in the past which caused muscle aches, weakness, and fatigue within 2 weeks of statin initiation. Then was started on Crestor 20mg  after his MI this past February. Reported headache in the back of his head on therapy. He stopped the statin for a few weeks and his headache resolved, then restarted it again and headache returned. He was not fasting when labs were drawn. Used to drink sweet tea but stopped a few months ago.  Current Medications: none Intolerances: rosuvastatin 20mg  daily Risk Factors: CAD s/p NSTEMI, DM LDL goal: 55mg /dL  Diet:  [place] breakfast: Cereal and banana Dinner: chicken, pinto beans, cole slaw Drinks: water   Exercise: Walks quite a bit  Family History: Heart disease in his sister.  Social History: Denies tobacco, alcohol, and drug use  Labs: 03/25/21: TC 136, TG 280, HDL 24, LDL 67 (on rosuvastatin 20mg  daily)  Past Medical History:  Diagnosis Date   Diabetes mellitus without complication (HCC)    Gout 06/06/2013   Hyperlipidemia LDL goal <70 06/06/2013   Hypertension    Hypothyroidism    Nephrolithiasis 06/06/2013   Non-ST elevation (NSTEMI) myocardial infarction (HCC) 12/24/2020    Current Outpatient Medications on  File Prior to Visit  Medication Sig Dispense Refill   Alogliptin Benzoate 12.5 MG TABS Take 12.5 mg by mouth daily.     amLODipine (NORVASC) 5 MG tablet Take 1 tablet (5 mg total) by mouth daily. 90 tablet 3   aspirin 81 MG chewable tablet Chew 1 tablet (81 mg total) by mouth daily.     febuxostat (ULORIC) 40 MG tablet Take 40 mg by mouth daily.     glipiZIDE (GLUCOTROL) 5 MG tablet Take 5 mg by mouth every morning.      isosorbide mononitrate (IMDUR) 30 MG 24 hr tablet Take 1 tablet (30 mg total) by mouth daily. 90 tablet 3   levothyroxine (SYNTHROID) 150 MCG tablet Take 150 mcg by mouth daily before breakfast.     metoprolol tartrate (LOPRESSOR) 25 MG tablet Take 0.5 tablets (12.5 mg total) by mouth 2 (two) times daily. 30 tablet 5   nitroGLYCERIN (NITROSTAT) 0.4 MG SL tablet Place 1 tablet (0.4 mg total) under the tongue every 5 (five) minutes x 3 doses as needed for chest pain. 25 tablet 3   pioglitazone (ACTOS) 30 MG tablet Take 30 mg by mouth daily.     ticagrelor (BRILINTA) 90 MG TABS tablet Take 1 tablet (90 mg total) by mouth 2 (two) times daily. 180 tablet 3   No current facility-administered medications on file prior to visit.  Allergies  Allergen Reactions   Statins     Had muscle aches in the past    Assessment/Plan:  1. Hyperlipidemia - Pt intolerant to Lipitor (myalgias) and Crestor (headache). LDL goal < 55 due to ASCVD + DM, TG goal < 150. Discussed rechallenging pravastatin, trying ezetimibe, or PCSK9i therapy. Pt wishes to retry statin therapy first. He actually prefers to retry Crestor 20mg  daily but move his dose to bedtime instead of AM to see if this resolves his headache. Will recheck fasting lipids in 6 weeks (pt not fasting for prior labs so TG should improve; Vascepa unfortunately tier 4 at $100/month). Advised pt to call clinic if headaches return and are bothersome, will plan to try pravastatin 40mg  HS in that case with addition of ezetimibe or PCSK9i if needed  (cost may be prohibitive and grant for assistance is currently closed).  Noelani Harbach E. Jaida Basurto, PharmD, BCACP, CPP [facility] [address], [place], [address] [place] Phone: [phone]; Fax: [phone] 04/28/2021 2:47 PM

## 2021-05-05 ENCOUNTER — Other Ambulatory Visit: Payer: Self-pay | Admitting: Physician Assistant

## 2021-05-27 ENCOUNTER — Other Ambulatory Visit: Payer: Self-pay

## 2021-05-27 ENCOUNTER — Emergency Department (HOSPITAL_COMMUNITY): Payer: No Typology Code available for payment source

## 2021-05-27 ENCOUNTER — Emergency Department (HOSPITAL_COMMUNITY)
Admission: EM | Admit: 2021-05-27 | Discharge: 2021-05-28 | Disposition: A | Discharge status: Home / Self Care | Payer: No Typology Code available for payment source | Attending: Emergency Medicine | Admitting: Emergency Medicine

## 2021-05-27 ENCOUNTER — Encounter (HOSPITAL_COMMUNITY): Payer: Self-pay | Admitting: Emergency Medicine

## 2021-05-27 DIAGNOSIS — Z955 Presence of coronary angioplasty implant and graft: Secondary | ICD-10-CM | POA: Insufficient documentation

## 2021-05-27 DIAGNOSIS — R0789 Other chest pain: Secondary | ICD-10-CM

## 2021-05-27 DIAGNOSIS — I2510 Atherosclerotic heart disease of native coronary artery without angina pectoris: Secondary | ICD-10-CM | POA: Diagnosis not present

## 2021-05-27 DIAGNOSIS — Z7982 Long term (current) use of aspirin: Secondary | ICD-10-CM | POA: Diagnosis not present

## 2021-05-27 DIAGNOSIS — E785 Hyperlipidemia, unspecified: Secondary | ICD-10-CM | POA: Diagnosis not present

## 2021-05-27 DIAGNOSIS — Z7984 Long term (current) use of oral hypoglycemic drugs: Secondary | ICD-10-CM | POA: Insufficient documentation

## 2021-05-27 DIAGNOSIS — Z7902 Long term (current) use of antithrombotics/antiplatelets: Secondary | ICD-10-CM | POA: Insufficient documentation

## 2021-05-27 DIAGNOSIS — E1169 Type 2 diabetes mellitus with other specified complication: Secondary | ICD-10-CM | POA: Insufficient documentation

## 2021-05-27 DIAGNOSIS — K8020 Calculus of gallbladder without cholecystitis without obstruction: Secondary | ICD-10-CM

## 2021-05-27 DIAGNOSIS — E039 Hypothyroidism, unspecified: Secondary | ICD-10-CM | POA: Insufficient documentation

## 2021-05-27 DIAGNOSIS — Z951 Presence of aortocoronary bypass graft: Secondary | ICD-10-CM | POA: Diagnosis not present

## 2021-05-27 DIAGNOSIS — Z79899 Other long term (current) drug therapy: Secondary | ICD-10-CM | POA: Insufficient documentation

## 2021-05-27 DIAGNOSIS — N289 Disorder of kidney and ureter, unspecified: Secondary | ICD-10-CM | POA: Diagnosis not present

## 2021-05-27 HISTORY — DX: Atherosclerotic heart disease of native coronary artery without angina pectoris: I25.10

## 2021-05-27 LAB — TROPONIN I (HIGH SENSITIVITY)
Troponin I (High Sensitivity): 3 ng/L (ref <18)
Troponin I (High Sensitivity): 3 ng/L (ref <18)

## 2021-05-27 LAB — COMPREHENSIVE METABOLIC PANEL
ALT: 20 U/L (ref 0–44)
AST: 22 U/L (ref 15–41)
Albumin: 4.2 g/dL (ref 3.5–5.0)
Alkaline Phosphatase: 94 U/L (ref 38–126)
Anion gap: 8 (ref 5–15)
BUN: 30 mg/dL — ABNORMAL HIGH (ref 8–23)
CO2: 23 mmol/L (ref 22–32)
Calcium: 10.2 mg/dL (ref 8.9–10.3)
Chloride: 108 mmol/L (ref 98–111)
Creatinine, Ser: 2.04 mg/dL — ABNORMAL HIGH (ref 0.61–1.24)
GFR, Estimated: 33 mL/min — ABNORMAL LOW (ref >60)
Glucose, Bld: 120 mg/dL — ABNORMAL HIGH (ref 70–99)
Potassium: 4.3 mmol/L (ref 3.5–5.1)
Sodium: 139 mmol/L (ref 135–145)
Total Bilirubin: 0.5 mg/dL (ref 0.3–1.2)
Total Protein: 8.1 g/dL (ref 6.5–8.1)

## 2021-05-27 LAB — CBC WITH DIFFERENTIAL/PLATELET
Abs Immature Granulocytes: 0.03 10*3/uL (ref 0.00–0.07)
Basophils Absolute: 0.1 10*3/uL (ref 0.0–0.1)
Basophils Relative: 1 %
Eosinophils Absolute: 0.1 10*3/uL (ref 0.0–0.5)
Eosinophils Relative: 2 %
HCT: 45.8 % (ref 39.0–52.0)
Hemoglobin: 15.1 g/dL (ref 13.0–17.0)
Immature Granulocytes: 0 %
Lymphocytes Relative: 11 %
Lymphs Abs: 0.8 10*3/uL (ref 0.7–4.0)
MCH: 28.4 pg (ref 26.0–34.0)
MCHC: 33 g/dL (ref 30.0–36.0)
MCV: 86.3 fL (ref 80.0–100.0)
Monocytes Absolute: 0.6 10*3/uL (ref 0.1–1.0)
Monocytes Relative: 8 %
Neutro Abs: 5.8 10*3/uL (ref 1.7–7.7)
Neutrophils Relative %: 78 %
Platelets: 162 10*3/uL (ref 150–400)
RBC: 5.31 MIL/uL (ref 4.22–5.81)
RDW: 13.8 % (ref 11.5–15.5)
WBC: 7.3 10*3/uL (ref 4.0–10.5)
nRBC: 0 % (ref 0.0–0.2)

## 2021-05-27 LAB — D-DIMER, QUANTITATIVE: D-Dimer, Quant: 1.43 ug/mL-FEU — ABNORMAL HIGH (ref 0.00–0.50)

## 2021-05-27 NOTE — ED Triage Notes (Signed)
Patient reports intermittent mid/upper chest pain this week with mild SOB , no emesis or diaphoresis , history of CAD with coronary stent , his cardiologist is Dr. Lucia Gaskins.

## 2021-05-27 NOTE — ED Provider Notes (Signed)
Emergency Medicine Provider Triage Evaluation Note  Victor Hutchinson. , a 75 y.o. male  was evaluated in triage.  Pt complains of chest pain.  Chest pain has been intermittent over the last week.  Pain is midsternal and does not radiate.  Patient endorses associated shortness of breath with this chest pain.  No nausea, vomiting, or diaphoresis.  Chest pain started today approximately 1300 after eating lunch.  Pain has been constant since then.  Patient reports resolution of pain for approximately 10 minutes after taking nitroglycerin.  Patient last took nitroglycerin 1 hour prior.  Patient denies any aggravation of symptoms with exertion.  Patient had left heart cath echo February 2022.  Cardiologist is Dr. Anne Fu.    Review of Systems  Positive: Chest pain, shortness of breath Negative: Nausea, vomiting, leg swelling, palpitations  Physical Exam  BP (!) 180/95 (BP Location: Left Arm)   Pulse 71   Temp 98.2 F (36.8 C) (Oral)   Resp 19   Ht 5\' 10"  (1.778 m)   Wt 95 kg   SpO2 98%   BMI 30.05 kg/m  Gen:   Awake, no distress   Resp:  Normal effort, lungs clear to auscultation laterally MSK:   Moves extremities without difficulty  Other:  Abdomen soft, nondistended, nontender.  +2 radial pulse bilaterally  Medical Decision Making  Medically screening exam initiated at 8:08 PM.  Appropriate orders placed.  . was informed that the remainder of the evaluation will be completed by another provider, this initial triage assessment does not replace that evaluation, and the importance of remaining in the ED until their evaluation is complete.  The patient appears stable so that the remainder of the work up may be completed by another provider.      [name] 05/27/21 2012    2013, MD 05/27/21 [phone]

## 2021-05-27 NOTE — ED Provider Notes (Signed)
[facility] EMERGENCY DEPARTMENT Provider Note   CSN: [id number] Arrival date & time: 05/27/21  2000     History Chief Complaint  Patient presents with   Chest Pain    Victor Hutchinson. is a 75 y.o. male.  The history is provided by the patient.  Chest Pain He has history of hypertension, diabetes, hyperlipidemia, coronary artery disease status post stent placement and comes in with episodic chest pain which started today.  He notes a dull ache in the upper sternal area when he stretches his arms, bends forward, or takes a deep breath.  Pain only lasts a few seconds and goes away as soon as he stands up or exhales.  There is no associated dyspnea, nausea, diaphoresis.  This pain is distinctly different from what he had with his heart attack in February of this year.  He has been compliant with his medications.   Past Medical History:  Diagnosis Date   Coronary artery disease    Diabetes mellitus without complication (HCC)    Gout 06/06/2013   Hyperlipidemia LDL goal <70 06/06/2013   Hypertension    Hypothyroidism    Nephrolithiasis 06/06/2013   Non-ST elevation (NSTEMI) myocardial infarction (HCC) 12/24/2020    Patient Active Problem List   Diagnosis Date Noted   Non-ST elevation (NSTEMI) myocardial infarction (HCC) 12/24/2020   Acute coronary syndrome (HCC) 12/24/2020   NSTEMI (non-ST elevated myocardial infarction) (HCC) 12/24/2020   ACS (acute coronary syndrome) (HCC)    Acute URI 03/01/2017   Cough 03/01/2017   Chest congestion 03/01/2017   UTI (urinary tract infection) 06/06/2013   Nephrolithiasis 06/06/2013   Type II diabetes mellitus (HCC) 06/06/2013   Hypertensive heart disease 06/06/2013   Hyperlipidemia 06/06/2013   Hypothyroidism in adult 06/06/2013    Past Surgical History:  Procedure Laterality Date   CORONARY STENT PLACEMENT     CORONARY/GRAFT ACUTE MI REVASCULARIZATION N/A 12/24/2020   Procedure: Coronary/Graft Acute MI  Revascularization;  Surgeon: Lennette Bihari, MD;  Location: [facility] INVASIVE CV LAB;  Service: Cardiovascular;  Laterality: N/A;   LEFT HEART CATH AND CORONARY ANGIOGRAPHY N/A 12/24/2020   Procedure: LEFT HEART CATH AND CORONARY ANGIOGRAPHY;  Surgeon: Lennette Bihari, MD;  Location: [facility] INVASIVE CV LAB;  Service: Cardiovascular;  Laterality: N/A;       Family History  Problem Relation Age of Onset   Heart disease Sister        had surgery, died in her 34s, hx cocaine use    Social History   Tobacco Use   Smoking status: Never   Smokeless tobacco: Never  Vaping Use   Vaping Use: Never used  Substance Use Topics   Alcohol use: No   Drug use: No    Home Medications Prior to Admission medications   Medication Sig Start Date End Date Taking? Authorizing Provider  Alogliptin Benzoate 12.5 MG TABS Take 12.5 mg by mouth daily. 11/18/20   [provider]  amLODipine (NORVASC) 5 MG tablet Take 1 tablet (5 mg total) by mouth daily. 02/25/21   Nahser, Deloris Ping, MD  aspirin 81 MG chewable tablet Chew 1 tablet (81 mg total) by mouth daily. 12/27/20   Azalee Course, PA  febuxostat (ULORIC) 40 MG tablet Take 40 mg by mouth daily.    [provider]  glipiZIDE (GLUCOTROL) 5 MG tablet Take 5 mg by mouth every morning.     [provider]  isosorbide mononitrate (IMDUR) 30 MG 24 hr tablet Take 1  tablet (30 mg total) by mouth daily. 02/25/21   Nahser, Deloris PingPhilip J, MD  levothyroxine (SYNTHROID) 150 MCG tablet Take 150 mcg by mouth daily before breakfast.    [provider]  metoprolol tartrate (LOPRESSOR) 25 MG tablet TAKE 0.5 TABLETS BY MOUTH 2 TIMES DAILY. 05/05/21   Azalee CourseMeng, Hao, PA  nitroGLYCERIN (NITROSTAT) 0.4 MG SL tablet Place 1 tablet (0.4 mg total) under the tongue every 5 (five) minutes x 3 doses as needed for chest pain. 12/26/20   Azalee CourseMeng, Hao, PA  pioglitazone (ACTOS) 30 MG tablet Take 30 mg by mouth daily. 02/22/16   [provider]  rosuvastatin (CRESTOR) 20 MG  tablet Take 1 tablet (20 mg total) by mouth daily. 04/28/21   Jake BatheSkains, Mark C, MD  ticagrelor (BRILINTA) 90 MG TABS tablet Take 1 tablet (90 mg total) by mouth 2 (two) times daily. 12/26/20   Azalee CourseMeng, Hao, PA    Allergies    Lipitor [atorvastatin]  Review of Systems   Review of Systems  Cardiovascular:  Positive for chest pain.  All other systems reviewed and are negative.  Physical Exam Updated Vital Signs BP (!) 173/86 (BP Location: Left Arm)   Pulse 77   Temp 98.2 F (36.8 C) (Oral)   Resp 14   Ht 5\' 10"  (1.778 m)   Wt 95 kg   SpO2 99%   BMI 30.05 kg/m   Physical Exam Vitals and nursing note reviewed.  75 year old male, resting comfortably and in no acute distress. Vital signs are significant for elevated blood pressure. Oxygen saturation is 99%, which is normal. Head is normocephalic and atraumatic. PERRLA, EOMI. Oropharynx is clear. Neck is nontender and supple without adenopathy or JVD. Back is nontender and there is no CVA tenderness. Lungs are clear without rales, wheezes, or rhonchi. Chest is nontender. Heart has regular rate and rhythm without murmur. Abdomen is soft, flat, nontender without masses or hepatosplenomegaly and peristalsis is normoactive. Extremities have no cyanosis or edema, full range of motion is present. Skin is warm and dry without rash. Neurologic: Mental status is normal, cranial nerves are intact, there are no motor or sensory deficits.  ED Results / Procedures / Treatments   Labs (all labs ordered are listed, but only abnormal results are displayed) Labs Reviewed  COMPREHENSIVE METABOLIC PANEL - Abnormal; Notable for the following components:      Result Value   Glucose, Bld 120 (*)    BUN 30 (*)    Creatinine, Ser 2.04 (*)    GFR, Estimated 33 (*)    All other components within normal limits  D-DIMER, QUANTITATIVE - Abnormal; Notable for the following components:   D-Dimer, Quant 1.43 (*)    All other components within normal limits   CBC WITH DIFFERENTIAL/PLATELET  TROPONIN I (HIGH SENSITIVITY)  TROPONIN I (HIGH SENSITIVITY)    EKG EKG Interpretation  Date/Time:  Friday May 27 2021 20:05:58 EDT Ventricular Rate:  70 PR Interval:  260 QRS Duration: 124 QT Interval:  414 QTC Calculation: 447 R Axis:   -19 Text Interpretation: Sinus rhythm with 1st degree A-V block Right bundle branch block Abnormal ECG No significant change from last tracing Confirmed by Alvester Chourifan, Matthew [phone]([id number]) on 05/27/2021 10:47:03 PM  Radiology DG Chest 2 View  Result Date: 05/27/2021 CLINICAL DATA:  Chest pain, coronary artery disease EXAM: CHEST - 2 VIEW COMPARISON:  12/24/2020 FINDINGS: Frontal and lateral views of the chest demonstrate an unremarkable cardiac silhouette. No acute airspace disease, effusion, or pneumothorax. No  acute bony abnormalities. IMPRESSION: 1. No acute intrathoracic process. Electronically Signed   By: Sharlet Salina M.D.   On: 05/27/2021 20:41   CT Angio Chest PE W and/or Wo Contrast  Result Date: 05/28/2021 CLINICAL DATA:  Chest pain, elevated D-dimer EXAM: CT ANGIOGRAPHY CHEST WITH CONTRAST TECHNIQUE: Multidetector CT imaging of the chest was performed using the standard protocol during bolus administration of intravenous contrast. Multiplanar CT image reconstructions and MIPs were obtained to evaluate the vascular anatomy. CONTRAST:  64mL OMNIPAQUE IOHEXOL 350 MG/ML SOLN COMPARISON:  None. FINDINGS: Cardiovascular: There is adequate opacification of the pulmonary arterial tree. No intraluminal filling defect identified to suggest acute pulmonary embolism. The central pulmonary arteries are of normal caliber. Interval distal left anterior descending coronary artery stenting has been performed. Mild coronary artery calcification. Global cardiac size within normal limits. No pericardial effusion. Mild atherosclerotic calcification within the thoracic aorta. No aortic aneurysm. Mediastinum/Nodes: Thyroid unremarkable. No  pathologic thoracic adenopathy. Esophagus unremarkable. Lungs/Pleura: Numerous bilateral calcified pulmonary nodules are again identified likely reflecting the sequela of a old granulomatous disease, all measuring less than 4 mm in size. Several noncalcified nodules are seen within the right upper lobe, all measuring less than 4 mm, stable since prior examination. The lungs are otherwise clear. No pneumothorax or pleural effusion. Central airways are widely patent. Upper Abdomen: Cholelithiasis.  No acute abnormality. Musculoskeletal: No lytic or blastic bone lesion identified. Osseous structures are age-appropriate. Review of the MIP images confirms the above findings. IMPRESSION: No pulmonary embolism. Interval left anterior descending coronary artery stenting. Multiple pulmonary nodules, the majority of which are calcified, all benign given their stability since prior examination. Cholelithiasis. Aortic Atherosclerosis (ICD10-I70.0). Electronically Signed   By: Helyn Numbers MD   On: 05/28/2021 03:40    Procedures Procedures   Medications Ordered in ED Medications  Rivaroxaban (XARELTO) tablet 15 mg (15 mg Oral Given 05/28/21 0048)  iohexol (OMNIPAQUE) 350 MG/ML injection 50 mL (50 mLs Intravenous Contrast Given 05/28/21 0302)    ED Course  I have reviewed the triage vital signs and the nursing notes.  Pertinent labs & imaging results that were available during my care of the patient were reviewed by me and considered in my medical decision making (see chart for details).   MDM Rules/Calculators/A&P                         Chest pain which seems to be musculoskeletal based on history although no tenderness noted on exam.  ECG is unchanged from prior, chest x-ray shows no acute process.  Initial troponin is normal.  Labs otherwise show mild to moderate renal insufficiency which is unchanged from baseline.  Doubt ACS given nature of pain and unchanged ECG and normal initial troponin.  No specific  risk factors for pulmonary embolism, but will screen with D-dimer.  Old records reviewed confirming hospitalization for STEMI in February with stent placed in LAD.  No other blockages greater than 50%.  Repeat troponin is unchanged.  D-dimer is elevated.  He is sent for CT angiogram of the chest which shows no evidence of pulmonary embolism.  Incidental finding of cholelithiasis.  Pulmonary nodules are present which are unchanged and do not require any follow-up.  Patient is advised to apply ice as needed, take acetaminophen as needed.  He needs to avoid NSAIDs because of renal insufficiency.  Follow-up with his cardiologist.  Return precautions discussed.  Final Clinical Impression(s) / ED Diagnoses Final diagnoses:  Chest  wall pain  Renal insufficiency  Calculus of gallbladder without cholecystitis without obstruction    Rx / DC Orders ED Discharge Orders     None        Dione Booze, MD 05/28/21 [phone]

## 2021-05-28 ENCOUNTER — Emergency Department (HOSPITAL_COMMUNITY): Payer: No Typology Code available for payment source

## 2021-05-28 MED ORDER — RIVAROXABAN 15 MG PO TABS
15.0000 mg | ORAL_TABLET | Freq: Once | ORAL | Status: AC
  Administered 2021-05-28: 15 mg via ORAL
  Filled 2021-05-28: qty 1

## 2021-05-28 MED ORDER — IOHEXOL 350 MG/ML SOLN
50.0000 mL | Freq: Once | INTRAVENOUS | Status: AC | PRN
  Administered 2021-05-28: 50 mL via INTRAVENOUS

## 2021-05-28 NOTE — ED Notes (Signed)
DR Preston Fleeting is aware that no Nuclear tech is on call, and on-call AP Tech does not take consults from [facility]

## 2021-05-28 NOTE — Discharge Instructions (Addendum)
Your evaluation today did not show any signs of a heart attack or blood clots in the lung.  Apply ice as needed.  Take acetaminophen as needed.  Return if you have any new or concerning symptoms.

## 2021-06-09 ENCOUNTER — Other Ambulatory Visit: Payer: Self-pay

## 2021-06-09 ENCOUNTER — Other Ambulatory Visit: Payer: Medicare HMO | Admitting: *Deleted

## 2021-06-09 ENCOUNTER — Telehealth: Payer: Self-pay | Admitting: Cardiology

## 2021-06-09 DIAGNOSIS — E785 Hyperlipidemia, unspecified: Secondary | ICD-10-CM | POA: Diagnosis not present

## 2021-06-09 LAB — HEPATIC FUNCTION PANEL
ALT: 15 IU/L (ref 0–44)
AST: 18 IU/L (ref 0–40)
Albumin: 4 g/dL (ref 3.7–4.7)
Alkaline Phosphatase: 114 IU/L (ref 44–121)
Bilirubin Total: 0.3 mg/dL (ref 0.0–1.2)
Bilirubin, Direct: <0.1 mg/dL (ref 0.00–0.40)
Total Protein: 6.6 g/dL (ref 6.0–8.5)

## 2021-06-09 LAB — LIPID PANEL
Chol/HDL Ratio: 6.5 ratio — ABNORMAL HIGH (ref 0.0–5.0)
Cholesterol, Total: 150 mg/dL (ref 100–199)
HDL: 23 mg/dL — ABNORMAL LOW (ref >39)
LDL Chol Calc (NIH): 75 mg/dL (ref 0–99)
Triglycerides: 319 mg/dL — ABNORMAL HIGH (ref 0–149)
VLDL Cholesterol Cal: 52 mg/dL — ABNORMAL HIGH (ref 5–40)

## 2021-06-09 NOTE — Telephone Encounter (Signed)
Victor Bathe, MD  Sharin Grave, RN; P Cv Div [facility] Triage With recent chest pain,  Let's get him set up for NUC stress.  Has known CAD, prior ACS  Donato Schultz, MD    Spoke with pt and reviewed recommendations per Dr. Anne Fu.  Pt states that they discovered his issues were caused my gall stones.  He states he feels like he is ok and this is what was causing him trouble.  Pt would like to hold off on stress test unless Dr. Anne Fu really feels he needs to do it.  Advised I will send him a message for review.

## 2021-06-10 ENCOUNTER — Other Ambulatory Visit (HOSPITAL_COMMUNITY): Payer: Self-pay

## 2021-06-14 ENCOUNTER — Telehealth: Payer: Self-pay

## 2021-06-14 DIAGNOSIS — E785 Hyperlipidemia, unspecified: Secondary | ICD-10-CM

## 2021-06-14 NOTE — Telephone Encounter (Signed)
Called pt and discussed lipids and recommendations. He states he has not been taking his rosuvastatin. He attempted to take it at night, but he still could not tolerate. He states discussing injectables with [facility] lipid clinic. He would like to discuss this further.   I advised him I would forward his request to his providers for follow up.

## 2021-06-14 NOTE — Telephone Encounter (Signed)
-----   Message from Jake Bathe, MD sent at 06/13/2021  8:45 PM EDT ----- LDL 75. Above goal of 70 On Crestor 20mg  PO QD Let's add Zetia 10mg  PO QD to Crestor.  Repeat labs in 3 months , MD

## 2021-06-16 ENCOUNTER — Other Ambulatory Visit: Payer: Self-pay | Admitting: Physician Assistant

## 2021-06-16 MED ORDER — PRAVASTATIN SODIUM 40 MG PO TABS: 40.0000 mg | ORAL_TABLET | Freq: Every evening | ORAL | 3 refills | Status: DC

## 2021-06-16 NOTE — Telephone Encounter (Signed)
I spoke with patient. He is willing to try pravastatin 40mg  daily. He has been intolerant to atrovastatin and pravastatin. Will check labs on 9/29. He will call 10/29 if he has any issues in the mean time. If LDL is above goal then can consider zetia of PCSK9i.

## 2021-06-21 ENCOUNTER — Encounter: Payer: Self-pay | Admitting: Pharmacist

## 2021-08-11 ENCOUNTER — Other Ambulatory Visit: Payer: Self-pay

## 2021-08-11 ENCOUNTER — Other Ambulatory Visit: Payer: Medicare HMO | Admitting: *Deleted

## 2021-08-11 DIAGNOSIS — E785 Hyperlipidemia, unspecified: Secondary | ICD-10-CM | POA: Diagnosis not present

## 2021-08-11 LAB — LIPID PANEL
Chol/HDL Ratio: 5 ratio (ref 0.0–5.0)
Cholesterol, Total: 124 mg/dL (ref 100–199)
HDL: 25 mg/dL — ABNORMAL LOW (ref >39)
LDL Chol Calc (NIH): 63 mg/dL (ref 0–99)
Triglycerides: 219 mg/dL — ABNORMAL HIGH (ref 0–149)
VLDL Cholesterol Cal: 36 mg/dL (ref 5–40)

## 2021-08-29 ENCOUNTER — Telehealth: Payer: Self-pay | Admitting: Student in an Organized Health Care Education/Training Program

## 2021-08-29 NOTE — Telephone Encounter (Signed)
Patient called to discuss having symptoms of palpitations. Said that shortly after supper time he felt like his heart was "skipping beats". He checked his pulse which was in the 80s and his SBP was in the 120s. He felt a little lightheaded as well so he checked his BG level which was in the low 60s. Later he checked his pulse and BP once more and it was 100, but it downtrended back to the 80s and SBP was in the 140s. His lightheadedness resolved. When I spoke to him he denies chest pain, SOB, weakness, numbness, or syncope. He has eaten since checking his BG levels being low. I reassured him that his symptoms seem benign and perhaps were 2/2 hypoglycemia. I further reassured him that his VS were WNL. He verbalized an understanding and feels fine currently. I told him to call back and/or go to the ED if he develops concerning symptoms such as persistent tachycardia, syncope, chest pain or SOB.

## 2021-09-02 ENCOUNTER — Other Ambulatory Visit (HOSPITAL_COMMUNITY): Payer: Self-pay

## 2021-09-05 ENCOUNTER — Other Ambulatory Visit (HOSPITAL_COMMUNITY): Payer: Self-pay

## 2021-11-23 ENCOUNTER — Ambulatory Visit: Payer: Medicare HMO | Admitting: Cardiology

## 2021-12-26 ENCOUNTER — Other Ambulatory Visit (HOSPITAL_COMMUNITY): Payer: Self-pay

## 2022-01-27 ENCOUNTER — Other Ambulatory Visit: Payer: Self-pay

## 2022-01-27 ENCOUNTER — Ambulatory Visit (INDEPENDENT_AMBULATORY_CARE_PROVIDER_SITE_OTHER): Payer: Medicare HMO | Admitting: Cardiology

## 2022-01-27 ENCOUNTER — Encounter: Payer: Self-pay | Admitting: Cardiology

## 2022-01-27 DIAGNOSIS — E7800 Pure hypercholesterolemia, unspecified: Secondary | ICD-10-CM

## 2022-01-27 DIAGNOSIS — I2510 Atherosclerotic heart disease of native coronary artery without angina pectoris: Secondary | ICD-10-CM | POA: Diagnosis not present

## 2022-01-27 NOTE — Progress Notes (Signed)
?Cardiology Office Note:   ? ?Date:  01/27/2022  ? ?ID:  Victor Locketobert W Mcduffee Jr., DOB 02/08/1946, MRN [id number] ? ?PCP:  [facility] ?  ?[facility] Providers ?Cardiologist:  Donato SchultzMark Kanyah Matsushima, MD    ? ?Referring MD: [facility]  ? ? ?History of Present Illness:   ? ?Victor LocketRobert W Heart Jr. is a 76 y.o. male here for follow-up of CAD diabetes hyperlipidemia.  Dr. Elease HashimotoNahser back in April 2022 saw Ms. DOD for chest discomfort, previously had had heart catheterization in February 2022.  Had successful PCI to the mid LAD. ? ?Has upper sternal CP center, left of center 1 minute to the next, trop neg in ER. NTG used. Nothing helps. Tylenol helps. Heart attack pain was lower in location.  ? ?Has had statin intolerance.  Able to tolerate pravastatin.  LDL currently 63. ? ?He has had some shortness of breath which he thinks is attributed to the Brilinta.  Since he is over 1 year we can stop it. ? ?Mild plaque bilaterally carotid ? ? ? ?Past Medical History:  ?Diagnosis Date  ? Coronary artery disease   ? Diabetes mellitus without complication (HCC)   ? Gout 06/06/2013  ? Hyperlipidemia LDL goal <70 06/06/2013  ? Hypertension   ? Hypothyroidism   ? Nephrolithiasis 06/06/2013  ? Non-ST elevation (NSTEMI) myocardial infarction (HCC) 12/24/2020  ? ? ?Past Surgical History:  ?Procedure Laterality Date  ? CORONARY STENT PLACEMENT    ? CORONARY/GRAFT ACUTE MI REVASCULARIZATION N/A 12/24/2020  ? Procedure: Coronary/Graft Acute MI Revascularization;  Surgeon: Lennette BihariKelly, Thomas A, MD;  [facility];  Service: Cardiovascular;  Laterality: N/A;  ? LEFT HEART CATH AND CORONARY ANGIOGRAPHY N/A 12/24/2020  ? Procedure: LEFT HEART CATH AND CORONARY ANGIOGRAPHY;  Surgeon: Lennette BihariKelly, Thomas A, MD;  [facility];  Service: Cardiovascular;  Laterality: N/A;  ? ? ?Current Medications: ?Current Meds  ?Medication Sig  ? Alogliptin Benzoate 12.5 MG TABS Take 12.5 mg by mouth daily.  ? amLODipine (NORVASC) 5 MG tablet Take 1  tablet (5 mg total) by mouth daily.  ? aspirin 81 MG chewable tablet Chew 1 tablet (81 mg total) by mouth daily.  ? Cholecalciferol 50 MCG (2000 UT) TABS daily at 6 (six) AM.  ? febuxostat (ULORIC) 40 MG tablet Take 40 mg by mouth daily.  ? glipiZIDE (GLUCOTROL) 5 MG tablet Take 5 mg by mouth every morning.   ? isosorbide mononitrate (IMDUR) 30 MG 24 hr tablet Take 1 tablet (30 mg total) by mouth daily.  ? levothyroxine (SYNTHROID) 150 MCG tablet Take 150 mcg by mouth daily before breakfast.  ? losartan (COZAAR) 100 MG tablet Take 1 tablet by mouth daily.  ? metoprolol tartrate (LOPRESSOR) 25 MG tablet TAKE 0.5 TABLETS BY MOUTH 2 TIMES DAILY.  ? nitroGLYCERIN (NITROSTAT) 0.4 MG SL tablet Place 1 tablet (0.4 mg total) under the tongue every 5 (five) minutes x 3 doses as needed for chest pain.  ? pioglitazone (ACTOS) 30 MG tablet Take 30 mg by mouth daily.  ? pravastatin (PRAVACHOL) 40 MG tablet Take 1 tablet (40 mg total) by mouth every evening.  ? [DISCONTINUED] ticagrelor (BRILINTA) 90 MG TABS tablet Take 1 tablet (90 mg total) by mouth 2 (two) times daily.  ?  ? ?Allergies:   Crestor [rosuvastatin] and Lipitor [atorvastatin]  ? ?Social History  ? ?Socioeconomic History  ? Marital status: Married  ?  Spouse name: Not on file  ? Number of children: Not  on file  ? Years of education: Not on file  ? Highest education level: Not on file  ?Occupational History  ? Not on file  ?Tobacco Use  ? Smoking status: Never  ? Smokeless tobacco: Never  ?Vaping Use  ? Vaping Use: Never used  ?Substance and Sexual Activity  ? Alcohol use: No  ? Drug use: No  ? Sexual activity: Yes  ?Other Topics Concern  ? Not on file  ?Social History Narrative  ? Not on file  ? ?Social Determinants of Health  ? ?Financial Resource Strain: Not on file  ?Food Insecurity: Not on file  ?Transportation Needs: Not on file  ?Physical Activity: Not on file  ?Stress: Not on file  ?Social Connections: Not on file  ?  ? ?Family History: ?The patient's family  history includes Heart disease in his sister. ? ?ROS:   ?Please see the history of present illness.    ? All other systems reviewed and are negative. ? ?EKGs/Labs/Other Studies Reviewed:   ? ?The following studies were reviewed today: ?Prior cardiac catheterization 2/21: ?Diagnostic ?Dominance: Right ?  ?  ?Intervention ?  ?  ?  ? ?EKG:  EKG is  ordered today.  The ekg ordered today demonstrates sinus rhythm right bundle branch block ? ?Recent Labs: ?05/27/2021: BUN 30; Creatinine, Ser 2.04; Hemoglobin 15.1; Platelets 162; Potassium 4.3; Sodium 139 ?06/09/2021: ALT 15  ?Recent Lipid Panel ?   ?Component Value Date/Time  ? CHOL 124 08/11/2021 0806  ? TRIG 219 (H) 08/11/2021 0806  ? HDL 25 (L) 08/11/2021 0806  ? CHOLHDL 5.0 08/11/2021 0806  ? CHOLHDL 8.3 12/24/2020 1259  ? VLDL UNABLE TO CALCULATE IF TRIGLYCERIDE OVER 400 mg/dL 09/62/8366 2947  ? LDLCALC 63 08/11/2021 0806  ? LDLDIRECT 142.4 (H) 12/24/2020 1259  ? ? ? ?Risk Assessment/Calculations:   ? ? ?    ? ?   ? ?Physical Exam:   ? ?VS:  BP 132/68   Pulse 69   Ht 5\' 10"  (1.778 m)   Wt 195 lb (88.5 kg)   SpO2 97%   BMI 27.98 kg/m?    ? ?Wt Readings from Last 3 Encounters:  ?01/27/22 195 lb (88.5 kg)  ?05/27/21 209 lb 7 oz (95 kg)  ?04/04/21 199 lb (90.3 kg)  ?  ? ?GEN:  Well nourished, well developed in no acute distress ?HEENT: Normal ?NECK: No JVD; No carotid bruits ?LYMPHATICS: No lymphadenopathy ?CARDIAC: RRR, no murmurs, no rubs, gallops ?RESPIRATORY:  Clear to auscultation without rales, wheezing or rhonchi  ?ABDOMEN: Soft, non-tender, non-distended ?MUSCULOSKELETAL:  No edema; No deformity  ?SKIN: Warm and dry ?NEUROLOGIC:  Alert and oriented x 3 ?PSYCHIATRIC:  Normal affect  ? ?ASSESSMENT:   ? ?1. Coronary artery disease involving native coronary artery of native heart without angina pectoris   ?2. Pure hypercholesterolemia   ? ?PLAN:   ? ?In order of problems listed above: ? ?Coronary artery disease involving native coronary artery of native heart  without angina pectoris ?Mid LAD stent in February 2022.  He can come off his Brilinta now.  This will help his shortness of breath.  Continue with aspirin 81.  He is on pravastatin for statin use.  His LDL is less than 70. ? ?Hyperlipidemia ?Crestor previously caused a headache.  He has tried all the statins.  He is able to tolerate the pravastatin.  Excellent.  LDL 67. ?  ? ? ? ?Medication Adjustments/Labs and Tests Ordered: ?Current medicines are reviewed at length with  the patient today.  Concerns regarding medicines are outlined above.  ?No orders of the defined types were placed in this encounter. ? ?No orders of the defined types were placed in this encounter. ? ? ?Patient Instructions  ?Medication Instructions:  ?Please discontinue your Brilinta. ?Continue all other medications as listed. ? ?*If you need a refill on your cardiac medications before your next appointment, please call your pharmacy* ? ?Follow-Up: ?At [facility], you and your health needs are our priority.  As part of our continuing mission to provide you with exceptional heart care, we have created designated Provider Care Teams.  These Care Teams include your primary Cardiologist (physician) and Advanced Practice Providers (APPs -  Physician Assistants and Nurse Practitioners) who all work together to provide you with the care you need, when you need it. ? ?We recommend signing up for the patient portal called "MyChart".  Sign up information is provided on this After Visit Summary.  MyChart is used to connect with patients for Virtual Visits (Telemedicine).  Patients are able to view lab/test results, encounter notes, upcoming appointments, etc.  Non-urgent messages can be sent to your provider as well.   ?To learn more about what you can do with MyChart, go to [email or web address].   ? ?Your next appointment:   ?1 year(s) ? ?The format for your next appointment:   ?In Person ? ?Provider:   ?Donato Schultz, MD   ? ?Thank you for choosing  [facility]!! ? ? ?  ? ?Signed, ?Donato Schultz, MD  ?01/27/2022 3:20 PM    ?[facility] ?

## 2022-01-27 NOTE — Assessment & Plan Note (Signed)
Crestor previously caused a headache.  He has tried all the statins.  He is able to tolerate the pravastatin.  Excellent.  LDL 67. ?

## 2022-01-27 NOTE — Patient Instructions (Signed)
Medication Instructions:  ?Please discontinue your Brilinta. ?Continue all other medications as listed. ? ?*If you need a refill on your cardiac medications before your next appointment, please call your pharmacy* ? ?Follow-Up: ?At [facility], you and your health needs are our priority.  As part of our continuing mission to provide you with exceptional heart care, we have created designated Provider Care Teams.  These Care Teams include your primary Cardiologist (physician) and Advanced Practice Providers (APPs -  Physician Assistants and Nurse Practitioners) who all work together to provide you with the care you need, when you need it. ? ?We recommend signing up for the patient portal called "MyChart".  Sign up information is provided on this After Visit Summary.  MyChart is used to connect with patients for Virtual Visits (Telemedicine).  Patients are able to view lab/test results, encounter notes, upcoming appointments, etc.  Non-urgent messages can be sent to your provider as well.   ?To learn more about what you can do with MyChart, go to [email or web address].   ? ?Your next appointment:   ?1 year(s) ? ?The format for your next appointment:   ?In Person ? ?Provider:   ?Donato Schultz, MD   ? ?Thank you for choosing [facility]!! ? ? ? ?

## 2022-01-27 NOTE — Assessment & Plan Note (Signed)
Mid LAD stent in February 2022.  He can come off his Brilinta now.  This will help his shortness of breath.  Continue with aspirin 81.  He is on pravastatin for statin use.  His LDL is less than 70. ?

## 2022-02-17 ENCOUNTER — Other Ambulatory Visit: Payer: Self-pay | Admitting: Physician Assistant

## 2022-03-13 ENCOUNTER — Ambulatory Visit: Payer: Medicare HMO | Admitting: Cardiology

## 2022-05-05 ENCOUNTER — Other Ambulatory Visit: Payer: Self-pay | Admitting: Physician Assistant

## 2022-06-11 ENCOUNTER — Other Ambulatory Visit: Payer: Self-pay | Admitting: Cardiology

## 2022-08-07 IMAGING — DX DG CHEST 2V
2 series · 2 of 2 positions shown · non-contrast
Comparison: None.

CLINICAL DATA: Chest pain.

EXAM:
CHEST - 2 VIEW

[w chest pa]
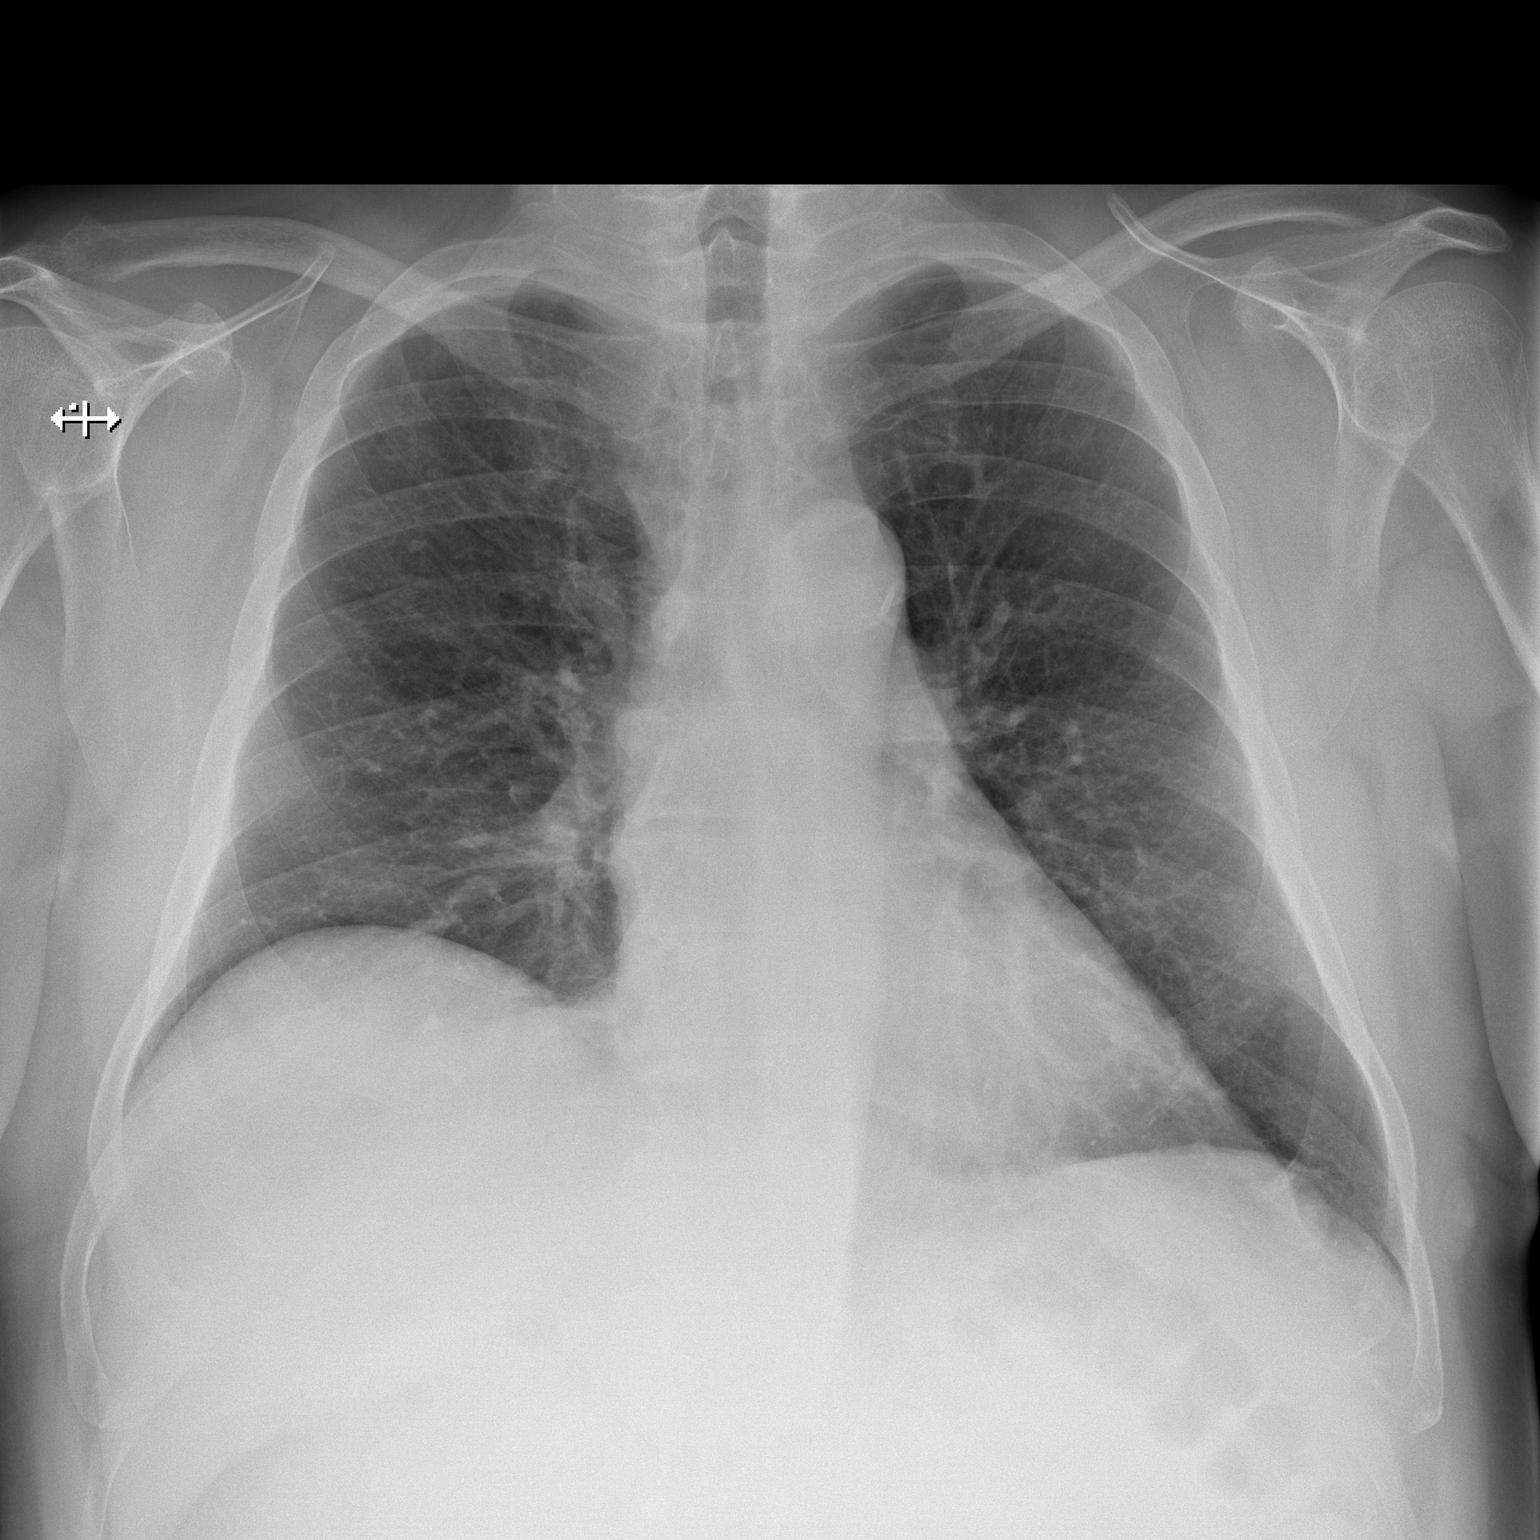

[w chest lat]
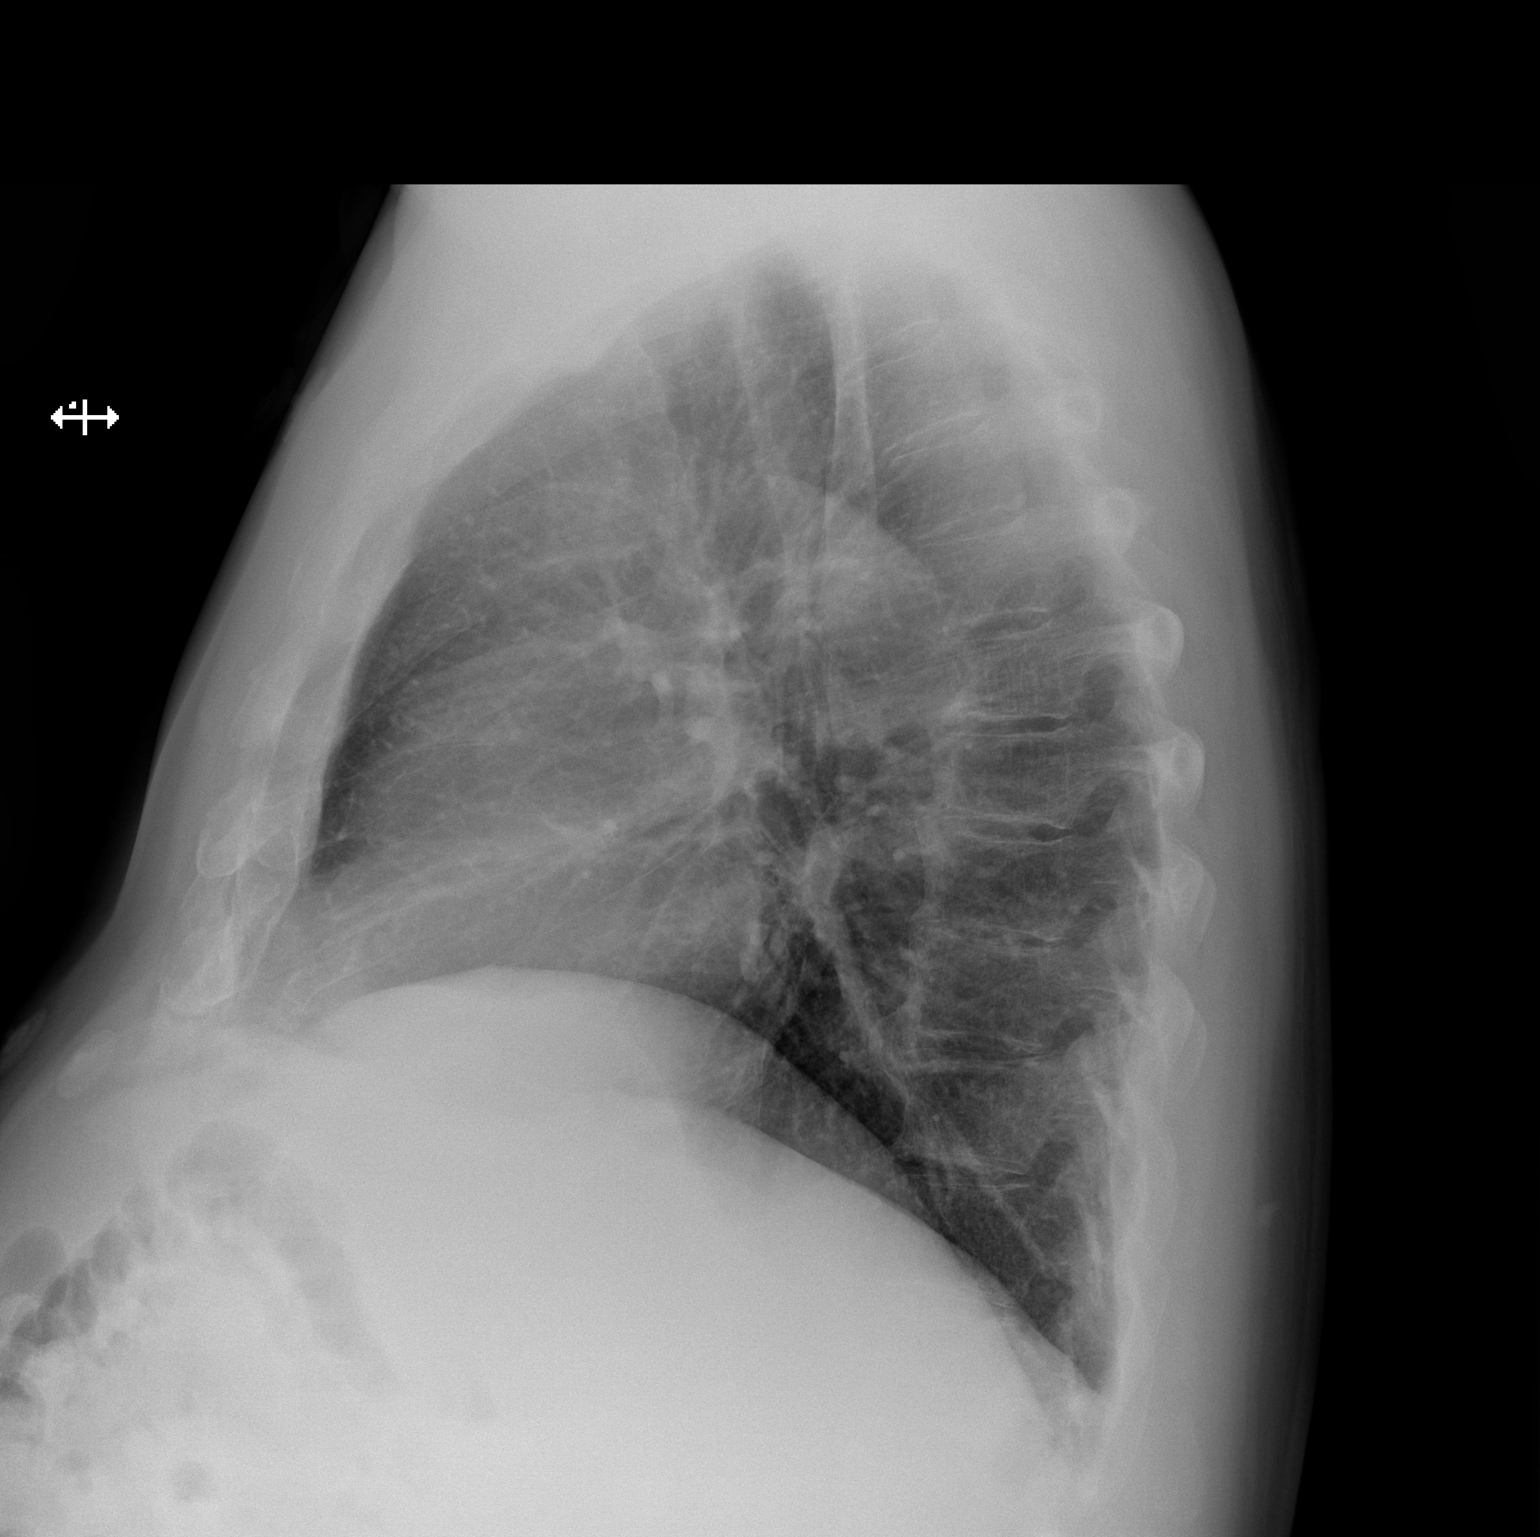

[2 of 2 positions shown; findings below may reference images not displayed]

FINDINGS: The heart size is normal. There is elevation of the right
hemidiaphragm. There is a probable pulmonary nodule overlying the
right upper lobe. There is no pneumothorax. No large pleural
effusion. No focal infiltrate. The trachea is midline. There is no
acute osseous abnormality. Aortic calcifications are noted
IMPRESSION: 1. Probable pulmonary nodule overlying the right upper lobe.
Follow-up with a nonemergent outpatient CT of the chest is
recommended.
2. No acute cardiopulmonary process.

## 2022-12-20 DIAGNOSIS — M791 Myalgia, unspecified site: Secondary | ICD-10-CM | POA: Diagnosis not present

## 2022-12-20 DIAGNOSIS — E782 Mixed hyperlipidemia: Secondary | ICD-10-CM | POA: Diagnosis not present

## 2022-12-20 DIAGNOSIS — M109 Gout, unspecified: Secondary | ICD-10-CM | POA: Diagnosis not present

## 2022-12-20 DIAGNOSIS — I10 Essential (primary) hypertension: Secondary | ICD-10-CM | POA: Diagnosis not present

## 2022-12-20 DIAGNOSIS — T466X5A Adverse effect of antihyperlipidemic and antiarteriosclerotic drugs, initial encounter: Secondary | ICD-10-CM | POA: Diagnosis not present

## 2022-12-20 DIAGNOSIS — N390 Urinary tract infection, site not specified: Secondary | ICD-10-CM | POA: Diagnosis not present

## 2022-12-20 DIAGNOSIS — N400 Enlarged prostate without lower urinary tract symptoms: Secondary | ICD-10-CM | POA: Diagnosis not present

## 2023-01-19 DIAGNOSIS — N400 Enlarged prostate without lower urinary tract symptoms: Secondary | ICD-10-CM | POA: Diagnosis not present

## 2023-01-19 DIAGNOSIS — M109 Gout, unspecified: Secondary | ICD-10-CM | POA: Diagnosis not present

## 2023-01-19 DIAGNOSIS — D649 Anemia, unspecified: Secondary | ICD-10-CM | POA: Diagnosis not present

## 2023-01-19 DIAGNOSIS — N390 Urinary tract infection, site not specified: Secondary | ICD-10-CM | POA: Diagnosis not present

## 2023-01-19 DIAGNOSIS — E118 Type 2 diabetes mellitus with unspecified complications: Secondary | ICD-10-CM | POA: Diagnosis not present

## 2023-01-19 DIAGNOSIS — N1832 Chronic kidney disease, stage 3b: Secondary | ICD-10-CM | POA: Diagnosis not present

## 2023-01-19 DIAGNOSIS — R351 Nocturia: Secondary | ICD-10-CM | POA: Diagnosis not present

## 2023-01-19 DIAGNOSIS — I10 Essential (primary) hypertension: Secondary | ICD-10-CM | POA: Diagnosis not present

## 2023-01-19 DIAGNOSIS — E1122 Type 2 diabetes mellitus with diabetic chronic kidney disease: Secondary | ICD-10-CM | POA: Diagnosis not present

## 2023-01-19 DIAGNOSIS — N189 Chronic kidney disease, unspecified: Secondary | ICD-10-CM | POA: Diagnosis not present

## 2023-01-22 ENCOUNTER — Other Ambulatory Visit: Payer: Self-pay | Admitting: *Deleted

## 2023-01-22 MED ORDER — METOPROLOL TARTRATE 25 MG PO TABS: 12.5000 mg | ORAL_TABLET | Freq: Two times a day (BID) | ORAL | 0 refills | Status: DC

## 2023-01-29 ENCOUNTER — Ambulatory Visit: Payer: Medicare HMO | Attending: Physician Assistant | Admitting: Physician Assistant

## 2023-01-29 ENCOUNTER — Encounter: Payer: Self-pay | Admitting: Physician Assistant

## 2023-01-29 VITALS — BP 128/70 | HR 73 | Ht 70.0 in | Wt 196.0 lb

## 2023-01-29 DIAGNOSIS — E785 Hyperlipidemia, unspecified: Secondary | ICD-10-CM

## 2023-01-29 DIAGNOSIS — I2510 Atherosclerotic heart disease of native coronary artery without angina pectoris: Secondary | ICD-10-CM | POA: Diagnosis not present

## 2023-01-29 MED ORDER — ISOSORBIDE MONONITRATE ER 30 MG PO TB24: 30.0000 mg | ORAL_TABLET | Freq: Every day | ORAL | 3 refills | Status: DC

## 2023-01-29 MED ORDER — METOPROLOL TARTRATE 25 MG PO TABS: 12.5000 mg | ORAL_TABLET | Freq: Two times a day (BID) | ORAL | 3 refills | Status: DC

## 2023-01-29 MED ORDER — NITROGLYCERIN 0.4 MG SL SUBL: 0.4000 mg | SUBLINGUAL_TABLET | SUBLINGUAL | 3 refills | Status: AC | PRN

## 2023-01-29 NOTE — Progress Notes (Signed)
Office Visit    Patient Name: [facility]. Date of Encounter: 01/29/2023  PCP:  [facility]  Cardiologist:  Candee Furbish, MD  Advanced Practice Provider:  No care team member to display Electrophysiologist:  None   HPI    Victor [facility]inson. is a 77 y.o. male with a past medical history significant for CAD, diabetes, hyperlipidemia, hypertension presents today for follow-up appointment.  He was seen by Dr. Acie Fredrickson April 2022 for chest discomfort.  Previously had a cardiac catheterization February 2022.  Had successful PCI to mid LAD.  At his last follow-up appointment a year ago he was having upper sternal chest pain which was in the left of center which lasted about 1 minute.  Troponin negative in ER.  Nitroglycerin used.  Usually nothing would help other than Tylenol.  Heart attack pain was lower in location.  Statin intolerance and was unable to tolerate pravastatin.  LDL was 63.  Had some shortness of breath which she attributed to the [place].  He stopped this medication over a year ago.  Mild bilateral carotid plaque.  Today, he tells me that his chest pain was thought to be gallstones.  It occurs more in the center of her chest.  He was told he might have to have his gallbladder removed at some point.  He was started on Jardiance and he stopped a few of his other medications.  He states they were stopped due to his kidney disease.  One of his medications close he is asked.  He does experience some dizziness from time to time but this is resolving.  Likely due to multiple medication changes.  He does need a nitroglycerin refill.  He shares with me that his brother who is 101 years old just had triple bypass.  He drinks and smokes which hopefully he is going to  quit.  Reports no shortness of breath nor dyspnea on exertion. Reports no chest pain, pressure, or tightness. No edema, orthopnea, PND. Reports no palpitations.     Past  Medical History    Past Medical History:  Diagnosis Date   Coronary artery disease    Diabetes mellitus without complication ([place])    Gout 06/06/2013   Hyperlipidemia LDL goal <70 06/06/2013   Hypertension    Hypothyroidism    Nephrolithiasis 06/06/2013   Non-ST elevation (NSTEMI) myocardial infarction ([place]) 12/24/2020   Past Surgical History:  Procedure Laterality Date   CORONARY STENT PLACEMENT     CORONARY/GRAFT ACUTE MI REVASCULARIZATION N/A 12/24/2020   Procedure: Coronary/Graft Acute MI Revascularization;  Surgeon: Troy Sine, MD;  Location: [facility];  Service: Cardiovascular;  Laterality: N/A;   LEFT HEART CATH AND CORONARY ANGIOGRAPHY N/A 12/24/2020   Procedure: LEFT HEART CATH AND CORONARY ANGIOGRAPHY;  Surgeon: Troy Sine, MD;  Location: [facility];  Service: Cardiovascular;  Laterality: N/A;    Allergies  Allergies  Allergen Reactions   Crestor [Rosuvastatin]     Myalgias on 20mg  dosing   Lipitor [Atorvastatin]     Muscle aches, fatigue, and weakness     EKGs/Labs/Other Studies Reviewed:   The following studies were reviewed today:  Prior cardiac catheterization 2/21: Diagnostic Dominance: Right     Intervention        EKG:  EKG is  ordered today.  The ekg ordered today demonstrates normal sinus rhythm, rate 73 bpm, first-degree AV block with PAC  Recent Labs: No  results found for requested labs within last 365 days.  Recent Lipid Panel    Component Value Date/Time   CHOL 124 08/11/2021 0806   TRIG 219 (H) 08/11/2021 0806   HDL 25 (L) 08/11/2021 0806   CHOLHDL 5.0 08/11/2021 0806   CHOLHDL 8.3 12/24/2020 1259   VLDL UNABLE TO CALCULATE IF TRIGLYCERIDE OVER 400 mg/dL 12/24/2020 1259   LDLCALC 63 08/11/2021 0806   LDLDIRECT 142.4 (H) 12/24/2020 1259    Home Medications   Current Meds  Medication Sig   Alogliptin Benzoate 12.5 MG TABS Take 12.5 mg by mouth daily.   Cholecalciferol 50 MCG (2000 UT) TABS daily at 6  (six) AM.   febuxostat (ULORIC) 40 MG tablet Take 40 mg by mouth daily.   JARDIANCE 10 MG TABS tablet Take 10 mg by mouth daily.   levothyroxine (SYNTHROID) 125 MCG tablet Take 125 mcg by mouth daily before breakfast.   tamsulosin (FLOMAX) 0.4 MG CAPS capsule Take 2 capsules by mouth at bedtime.   [DISCONTINUED] aspirin 81 MG chewable tablet Chew 1 tablet (81 mg total) by mouth daily.   [DISCONTINUED] glipiZIDE (GLUCOTROL) 5 MG tablet Take 5 mg by mouth every morning.    [DISCONTINUED] isosorbide mononitrate (IMDUR) 30 MG 24 hr tablet TAKE 1 TABLET BY MOUTH EVERY DAY   [DISCONTINUED] levothyroxine (SYNTHROID) 150 MCG tablet Take 150 mcg by mouth daily before breakfast.   [DISCONTINUED] losartan (COZAAR) 100 MG tablet Take 1 tablet by mouth daily.   [DISCONTINUED] metoprolol tartrate (LOPRESSOR) 25 MG tablet Take 0.5 tablets (12.5 mg total) by mouth 2 (two) times daily.   [DISCONTINUED] nitroGLYCERIN (NITROSTAT) 0.4 MG SL tablet Place 1 tablet (0.4 mg total) under the tongue every 5 (five) minutes x 3 doses as needed for chest pain.   [DISCONTINUED] pioglitazone (ACTOS) 30 MG tablet Take 30 mg by mouth daily.   [DISCONTINUED] pravastatin (PRAVACHOL) 40 MG tablet TAKE 1 TABLET BY MOUTH EVERY DAY IN THE EVENING     Review of Systems      All other systems reviewed and are otherwise negative except as noted above.  Physical Exam    VS:  BP 128/70   Pulse 73   Ht 5\' 10"  (1.778 m)   Wt 196 lb (88.9 kg)   SpO2 96%   BMI 28.12 kg/m  , BMI Body mass index is 28.12 kg/m.  Wt Readings from Last 3 Encounters:  01/29/23 196 lb (88.9 kg)  01/27/22 195 lb (88.5 kg)  05/27/21 209 lb 7 oz (95 kg)     GEN: Well nourished, well developed, in no acute distress. HEENT: normal. Neck: Supple, no JVD, carotid bruits, or masses. Cardiac: RRR, no murmurs, rubs, or gallops. No clubbing, cyanosis, edema.  Radials/PT 2+ and equal bilaterally.  Respiratory:  Respirations regular and unlabored, clear to  auscultation bilaterally. GI: Soft, nontender, nondistended. MS: No deformity or atrophy. Skin: Warm and dry, no rash. Neuro:  Strength and sensation are intact. Psych: Normal affect.  Assessment & Plan    Coronary artery disease -PCI February 2022 -Continue current medication which includes Imdur 30 mg daily, Lopressor 12.5 mg daily, nitro as needed, Jardiance 10 mg daily -He recently discontinued his aspirin due to his kidney disease -No recent chest pain  Hyperlipidemia -His pravastatin by his primary.  LDL 63 HDL low 25, triglycerides 219 -Would encourage a repeat lipid panel and LFTs and if needed restart back on statin or a different medication that would be more renal friendly for cholesterol  Disposition: Follow up 1 year with Candee Furbish, MD or APP.  Signed, Elgie Collard, PA-C 01/29/2023, 4:31 PM [facility]

## 2023-01-29 NOTE — Patient Instructions (Signed)
Medication Instructions:  Your physician recommends that you continue on your current medications as directed. Please refer to the Current Medication list given to you today.  *If you need a refill on your cardiac medications before your next appointment, please call your pharmacy*  Lab Work: None If you have labs (blood work) drawn today and your tests are completely normal, you will receive your results only by: [place] (if you have MyChart) OR A paper copy in the mail If you have any lab test that is abnormal or we need to change your treatment, we will call you to review the results.  Follow-Up: At [facility], you and your health needs are our priority.  As part of our continuing mission to provide you with exceptional heart care, we have created designated Provider Care Teams.  These Care Teams include your primary Cardiologist (physician) and Advanced Practice Providers (APPs -  Physician Assistants and Nurse Practitioners) who all work together to provide you with the care you need, when you need it.  Your next appointment:   1 year(s)  Provider:   Candee Furbish, MD    Other Instructions Check your blood pressure daily, one hour after taking your morning medications for 2 weeks, keep a log and send [place] the readings through mychart at the end of the 2 weeks.  Low-Sodium Eating Plan Sodium, which is an element that makes up salt, helps you maintain a healthy balance of fluids in your body. Too much sodium can increase your blood pressure and cause fluid and waste to be held in your body. Your health care provider or dietitian may recommend following this plan if you have high blood pressure (hypertension), kidney disease, liver disease, or heart failure. Eating less sodium can help lower your blood pressure, reduce swelling, and protect your heart, liver, and kidneys. What are tips for following this plan? Reading food labels The Nutrition Facts label lists the amount  of sodium in one serving of the food. If you eat more than one serving, you must multiply the listed amount of sodium by the number of servings. Choose foods with less than 140 mg of sodium per serving. Avoid foods with 300 mg of sodium or more per serving. Shopping  Look for lower-sodium products, often labeled as "low-sodium" or "no salt added." Always check the sodium content, even if foods are labeled as "unsalted" or "no salt added." Buy fresh foods. Avoid canned foods and pre-made or frozen meals. Avoid canned, cured, or processed meats. Buy breads that have less than 80 mg of sodium per slice. Cooking  Eat more home-cooked food and less restaurant, buffet, and fast food. Avoid adding salt when cooking. Use salt-free seasonings or herbs instead of table salt or sea salt. Check with your health care provider or pharmacist before using salt substitutes. Cook with plant-based oils, such as canola, sunflower, or olive oil. Meal planning When eating at a restaurant, ask that your food be prepared with less salt or no salt, if possible. Avoid dishes labeled as brined, pickled, cured, smoked, or made with soy sauce, miso, or teriyaki sauce. Avoid foods that contain MSG (monosodium glutamate). MSG is sometimes added to [place] food, bouillon, and some canned foods. Make meals that can be grilled, baked, poached, roasted, or steamed. These are generally made with less sodium. General information Most people on this plan should limit their sodium intake to 1,500-2,000 mg (milligrams) of sodium each day. What foods should I eat? Fruits Fresh, frozen, or  canned fruit. Fruit juice. Vegetables Fresh or frozen vegetables. "No salt added" canned vegetables. "No salt added" tomato sauce and paste. Low-sodium or reduced-sodium tomato and vegetable juice. Grains Low-sodium cereals, including oats, puffed wheat and rice, and shredded wheat. Low-sodium crackers. Unsalted rice. Unsalted pasta. Low-sodium  bread. Whole-grain breads and whole-grain pasta. Meats and other proteins Fresh or frozen (no salt added) meat, poultry, seafood, and fish. Low-sodium canned tuna and salmon. Unsalted nuts. Dried peas, beans, and lentils without added salt. Unsalted canned beans. Eggs. Unsalted nut butters. Dairy Milk. Soy milk. Cheese that is naturally low in sodium, such as ricotta cheese, fresh mozzarella, or Swiss cheese. Low-sodium or reduced-sodium cheese. Cream cheese. Yogurt. Seasonings and condiments Fresh and dried herbs and spices. Salt-free seasonings. Low-sodium mustard and ketchup. Sodium-free salad dressing. Sodium-free light mayonnaise. Fresh or refrigerated horseradish. Lemon juice. Vinegar. Other foods Homemade, reduced-sodium, or low-sodium soups. Unsalted popcorn and pretzels. Low-salt or salt-free chips. The items listed above may not be a complete list of foods and beverages you can eat. Contact a dietitian for more information. What foods should I avoid? Vegetables Sauerkraut, pickled vegetables, and relishes. Olives. [place] fries. Onion rings. Regular canned vegetables (not low-sodium or reduced-sodium). Regular canned tomato sauce and paste (not low-sodium or reduced-sodium). Regular tomato and vegetable juice (not low-sodium or reduced-sodium). Frozen vegetables in sauces. Grains Instant hot cereals. Bread stuffing, pancake, and biscuit mixes. Croutons. Seasoned rice or pasta mixes. Noodle soup cups. Boxed or frozen macaroni and cheese. Regular salted crackers. Self-rising flour. Meats and other proteins Meat or fish that is salted, canned, smoked, spiced, or pickled. Precooked or cured meat, such as sausages or meat loaves. [place] Salines. Ham. Pepperoni. Hot dogs. Corned beef. Chipped beef. Salt pork. Jerky. Pickled herring. Anchovies and sardines. Regular canned tuna. Salted nuts. Dairy Processed cheese and cheese spreads. Hard cheeses. Cheese curds. Blue cheese. Feta cheese. String cheese.  Regular cottage cheese. Buttermilk. Canned milk. Fats and oils Salted butter. Regular margarine. Ghee. Bacon fat. Seasonings and condiments Onion salt, garlic salt, seasoned salt, table salt, and sea salt. Canned and packaged gravies. Worcestershire sauce. Tartar sauce. Barbecue sauce. Teriyaki sauce. Soy sauce, including reduced-sodium. Steak sauce. Fish sauce. Oyster sauce. Cocktail sauce. Horseradish that you find on the shelf. Regular ketchup and mustard. Meat flavorings and tenderizers. Bouillon cubes. Hot sauce. Pre-made or packaged marinades. Pre-made or packaged taco seasonings. Relishes. Regular salad dressings. Salsa. Other foods Salted popcorn and pretzels. Corn chips and puffs. Potato and tortilla chips. Canned or dried soups. Pizza. Frozen entrees and pot pies. The items listed above may not be a complete list of foods and beverages you should avoid. Contact a dietitian for more information. Summary Eating less sodium can help lower your blood pressure, reduce swelling, and protect your heart, liver, and kidneys. Most people on this plan should limit their sodium intake to 1,500-2,000 mg (milligrams) of sodium each day. Canned, boxed, and frozen foods are high in sodium. Restaurant foods, fast foods, and pizza are also very high in sodium. You also get sodium by adding salt to food. Try to cook at home, eat more fresh fruits and vegetables, and eat less fast food and canned, processed, or prepared foods. This information is not intended to replace advice given to you by your health care provider. Make sure you discuss any questions you have with your health care provider. Document Revised: 10/06/2019 Document Reviewed: 10/01/2019 Elsevier Patient Education  [place] Many factors influence your heart health, including  eating and exercise habits. Heart health is also called coronary health. Coronary risk increases with abnormal blood fat (lipid)  levels. A heart-healthy eating plan includes limiting unhealthy fats, increasing healthy fats, limiting salt (sodium) intake, and making other diet and lifestyle changes. What is my plan? Your health care provider may recommend that: You limit your fat intake to _________% or less of your total calories each day. You limit your saturated fat intake to _________% or less of your total calories each day. You limit the amount of cholesterol in your diet to less than _________ mg per day. You limit the amount of sodium in your diet to less than _________ mg per day. What are tips for following this plan? Cooking Cook foods using methods other than frying. Baking, boiling, grilling, and broiling are all good options. Other ways to reduce fat include: Removing the skin from poultry. Removing all visible fats from meats. Steaming vegetables in water or broth. Meal planning  At meals, imagine dividing your plate into fourths: Fill one-half of your plate with vegetables and green salads. Fill one-fourth of your plate with whole grains. Fill one-fourth of your plate with lean protein foods. Eat 2-4 cups of vegetables per day. One cup of vegetables equals 1 cup (91 g) broccoli or cauliflower florets, 2 medium carrots, 1 large bell pepper, 1 large sweet potato, 1 large tomato, 1 medium white potato, 2 cups (150 g) raw leafy greens. Eat 1-2 cups of fruit per day. One cup of fruit equals 1 small apple, 1 large banana, 1 cup (237 g) mixed fruit, 1 large orange,  cup (82 g) dried fruit, 1 cup (240 mL) 100% fruit juice. Eat more foods that contain soluble fiber. Examples include apples, broccoli, carrots, beans, peas, and barley. Aim to get 25-30 g of fiber per day. Increase your consumption of legumes, nuts, and seeds to 4-5 servings per week. One serving of dried beans or legumes equals  cup (90 g) cooked, 1 serving of nuts is  oz (12 almonds, 24 pistachios, or 7 walnut halves), and 1 serving of seeds  equals  oz (8 g). Fats Choose healthy fats more often. Choose monounsaturated and polyunsaturated fats, such as olive and canola oils, avocado oil, flaxseeds, walnuts, almonds, and seeds. Eat more omega-3 fats. Choose salmon, mackerel, sardines, tuna, flaxseed oil, and ground flaxseeds. Aim to eat fish at least 2 times each week. Check food labels carefully to identify foods with trans fats or high amounts of saturated fat. Limit saturated fats. These are found in animal products, such as meats, butter, and cream. Plant sources of saturated fats include palm oil, palm kernel oil, and coconut oil. Avoid foods with partially hydrogenated oils in them. These contain trans fats. Examples are stick margarine, some tub margarines, cookies, crackers, and other baked goods. Avoid fried foods. General information Eat more home-cooked food and less restaurant, buffet, and fast food. Limit or avoid alcohol. Limit foods that are high in added sugar and simple starches such as foods made using white refined flour (white breads, pastries, sweets). Lose weight if you are overweight. Losing just 5-10% of your body weight can help your overall health and prevent diseases such as diabetes and heart disease. Monitor your sodium intake, especially if you have high blood pressure. Talk with your health care provider about your sodium intake. Try to incorporate more vegetarian meals weekly. What foods should I eat? Fruits All fresh, canned (in natural juice), or frozen fruits. Vegetables Fresh or frozen vegetables (  raw, steamed, roasted, or grilled). Green salads. Grains Most grains. Choose whole wheat and whole grains most of the time. Rice and pasta, including brown rice and pastas made with whole wheat. Meats and other proteins Lean, well-trimmed beef, veal, pork, and lamb. Chicken and [place] without skin. All fish and shellfish. Wild duck, rabbit, pheasant, and venison. Egg whites or low-cholesterol egg  substitutes. Dried beans, peas, lentils, and tofu. Seeds and most nuts. Dairy Low-fat or nonfat cheeses, including ricotta and mozzarella. Skim or 1% milk (liquid, powdered, or evaporated). Buttermilk made with low-fat milk. Nonfat or low-fat yogurt. Fats and oils Non-hydrogenated (trans-free) margarines. Vegetable oils, including soybean, sesame, sunflower, olive, avocado, peanut, safflower, corn, canola, and cottonseed. Salad dressings or mayonnaise made with a vegetable oil. Beverages Water (mineral or sparkling). Coffee and tea. Unsweetened ice tea. Diet beverages. Sweets and desserts Sherbet, gelatin, and fruit ice. Small amounts of dark chocolate. Limit all sweets and desserts. Seasonings and condiments All seasonings and condiments. The items listed above may not be a complete list of foods and beverages you can eat. Contact a dietitian for more options. What foods should I avoid? Fruits Canned fruit in heavy syrup. Fruit in cream or butter sauce. Fried fruit. Limit coconut. Vegetables Vegetables cooked in cheese, cream, or butter sauce. Fried vegetables. Grains Breads made with saturated or trans fats, oils, or whole milk. Croissants. Sweet rolls. Donuts. High-fat crackers, such as cheese crackers and chips. Meats and other proteins Fatty meats, such as hot dogs, ribs, sausage, bacon, rib-eye roast or steak. High-fat deli meats, such as salami and bologna. Caviar. Domestic duck and goose. Organ meats, such as liver. Dairy Cream, sour cream, cream cheese, and creamed cottage cheese. Whole-milk cheeses. Whole or 2% milk (liquid, evaporated, or condensed). Whole buttermilk. Cream sauce or high-fat cheese sauce. Whole-milk yogurt. Fats and oils Meat fat, or shortening. Cocoa butter, hydrogenated oils, palm oil, coconut oil, palm kernel oil. Solid fats and shortenings, including bacon fat, salt pork, lard, and butter. Nondairy cream substitutes. Salad dressings with cheese or sour  cream. Beverages Regular sodas and any drinks with added sugar. Sweets and desserts Frosting. Pudding. Cookies. Cakes. Pies. Milk chocolate or white chocolate. Buttered syrups. Full-fat ice cream or ice cream drinks. The items listed above may not be a complete list of foods and beverages to avoid. Contact a dietitian for more information. Summary Heart-healthy meal planning includes limiting unhealthy fats, increasing healthy fats, limiting salt (sodium) intake and making other diet and lifestyle changes. Lose weight if you are overweight. Losing just 5-10% of your body weight can help your overall health and prevent diseases such as diabetes and heart disease. Focus on eating a balance of foods, including fruits and vegetables, low-fat or nonfat dairy, lean protein, nuts and legumes, whole grains, and heart-healthy oils and fats. This information is not intended to replace advice given to you by your health care provider. Make sure you discuss any questions you have with your health care provider. Document Revised: 12/05/2021 Document Reviewed: 12/05/2021 Elsevier Patient Education  [place].

## 2023-02-02 ENCOUNTER — Other Ambulatory Visit: Payer: Self-pay | Admitting: Cardiology

## 2023-02-02 DIAGNOSIS — N390 Urinary tract infection, site not specified: Secondary | ICD-10-CM | POA: Diagnosis not present

## 2023-02-02 DIAGNOSIS — N183 Chronic kidney disease, stage 3 unspecified: Secondary | ICD-10-CM | POA: Diagnosis not present

## 2023-02-02 DIAGNOSIS — E1122 Type 2 diabetes mellitus with diabetic chronic kidney disease: Secondary | ICD-10-CM | POA: Diagnosis not present

## 2023-02-05 DIAGNOSIS — N390 Urinary tract infection, site not specified: Secondary | ICD-10-CM | POA: Diagnosis not present

## 2023-02-12 DIAGNOSIS — N390 Urinary tract infection, site not specified: Secondary | ICD-10-CM | POA: Diagnosis not present

## 2023-03-31 ENCOUNTER — Telehealth: Payer: Self-pay | Admitting: Student

## 2023-03-31 NOTE — Telephone Encounter (Signed)
  Patient called After Hours Line with concerns about irregular heart beat and dizziness. Called and spoke with patient. He states he thinks he may be going in and out of atrial fibrillation. He describes episodes of tachypalpitations with associated dizziness. No associated chest pain or shortness of breath. He states these episodes have been occurring on a daily basis lately and can sometimes last all day. He had another episode this morning but it has since resolved. Most recent vitals (after this morning's episode) showed BP of 103/60 and heart rate of 61 bpm. He is current asymptomatic. I was able to schedule an office visit  for patient this coming week on 04/05/2023 at 2:45pm with Jari Favre, PA-C. We can check an EKG at that time. In the meantime, recommended the [facility] device or a smart watch. May end up needing an outpatient monitor. Discussed ED precautions. Patient was very appreciative of the call.  I will route this message to Julian Hy to make her aware of upcoming appointment.  Corrin Parker, PA-C 03/31/2023 12:58 PM

## 2023-04-01 ENCOUNTER — Encounter (HOSPITAL_COMMUNITY): Payer: Self-pay | Admitting: Emergency Medicine

## 2023-04-01 ENCOUNTER — Emergency Department (HOSPITAL_COMMUNITY)
Admission: EM | Admit: 2023-04-01 | Discharge: 2023-04-02 | Disposition: A | Discharge status: Home / Self Care | Payer: Medicare HMO | Attending: Emergency Medicine | Admitting: Emergency Medicine

## 2023-04-01 ENCOUNTER — Other Ambulatory Visit: Payer: Self-pay

## 2023-04-01 ENCOUNTER — Emergency Department (HOSPITAL_COMMUNITY): Payer: Medicare HMO

## 2023-04-01 DIAGNOSIS — E119 Type 2 diabetes mellitus without complications: Secondary | ICD-10-CM | POA: Insufficient documentation

## 2023-04-01 DIAGNOSIS — Z79899 Other long term (current) drug therapy: Secondary | ICD-10-CM | POA: Diagnosis not present

## 2023-04-01 DIAGNOSIS — E039 Hypothyroidism, unspecified: Secondary | ICD-10-CM | POA: Diagnosis not present

## 2023-04-01 DIAGNOSIS — R002 Palpitations: Secondary | ICD-10-CM | POA: Diagnosis not present

## 2023-04-01 DIAGNOSIS — I10 Essential (primary) hypertension: Secondary | ICD-10-CM | POA: Diagnosis not present

## 2023-04-01 DIAGNOSIS — R0789 Other chest pain: Secondary | ICD-10-CM

## 2023-04-01 DIAGNOSIS — Z79890 Hormone replacement therapy (postmenopausal): Secondary | ICD-10-CM | POA: Insufficient documentation

## 2023-04-01 DIAGNOSIS — I2510 Atherosclerotic heart disease of native coronary artery without angina pectoris: Secondary | ICD-10-CM | POA: Insufficient documentation

## 2023-04-01 LAB — BASIC METABOLIC PANEL
Anion gap: 10 (ref 5–15)
BUN: 37 mg/dL — ABNORMAL HIGH (ref 8–23)
CO2: 22 mmol/L (ref 22–32)
Calcium: 9.9 mg/dL (ref 8.9–10.3)
Chloride: 108 mmol/L (ref 98–111)
Creatinine, Ser: 2.33 mg/dL — ABNORMAL HIGH (ref 0.61–1.24)
GFR, Estimated: 28 mL/min — ABNORMAL LOW (ref >60)
Glucose, Bld: 108 mg/dL — ABNORMAL HIGH (ref 70–99)
Potassium: 4.3 mmol/L (ref 3.5–5.1)
Sodium: 140 mmol/L (ref 135–145)

## 2023-04-01 LAB — CBC
HCT: 43.1 % (ref 39.0–52.0)
Hemoglobin: 13.9 g/dL (ref 13.0–17.0)
MCH: 28.1 pg (ref 26.0–34.0)
MCHC: 32.3 g/dL (ref 30.0–36.0)
MCV: 87.2 fL (ref 80.0–100.0)
Platelets: 137 K/uL — ABNORMAL LOW (ref 150–400)
RBC: 4.94 MIL/uL (ref 4.22–5.81)
RDW: 13.6 % (ref 11.5–15.5)
WBC: 4.9 K/uL (ref 4.0–10.5)
nRBC: 0 % (ref 0.0–0.2)

## 2023-04-01 LAB — TROPONIN I (HIGH SENSITIVITY): Troponin I (High Sensitivity): 11 ng/L

## 2023-04-01 NOTE — ED Triage Notes (Addendum)
Pt reports his heart has been having irregular heart beat, "sometimes it is racing and sometimes it is missing a beat."  He has an his Cards on Thursday but tonight it "got bad and I thought I needed to come in."  Pt indicated that his blood pressure got "real low 90/60"   Pt indicated that the symptoms get really bad "right after I take my medications."  Pt indicates that there is no pain just dizziness associated w/ the irregular heart beats.

## 2023-04-02 LAB — TROPONIN I (HIGH SENSITIVITY): Troponin I (High Sensitivity): 10 ng/L (ref <18)

## 2023-04-02 NOTE — ED Provider Notes (Signed)
[place] EMERGENCY DEPARTMENT AT [facility] Provider Note  CSN: [id number] Arrival date & time: 04/01/23 2218  Chief Complaint(s) Irregular Heart Beat   HPI Victor Hutchinson. is a 77 y.o. male with a past medical history listed below including hypertension, hyperlipidemia, diabetes, prior NSTEMI s/p stent in LAD here for intermittent palpitations for the several weeks.  Patient reports that he checks his blood pressure at home with a device and his blood pressures vary.  He also states that his heart rates go from the  60s to the 90s which makes him concerned.  He is also endorsing nonassociated upper chest discomfort that is nonexertional.  It improves with activity and worsens with being supine.  He did state that he has been having more frequent acid reflux as well.  This began after PA put him on Jardiance.  He has since gone back to his previous medications.  Since arriving to the emergency department, patient reports feeling the best he has in the last month.  He is currently denying any symptoms.  The history is provided by the patient.    Past Medical History Past Medical History:  Diagnosis Date   Coronary artery disease    Diabetes mellitus without complication (HCC)    Gout 06/06/2013   Hyperlipidemia LDL goal <70 06/06/2013   Hypertension    Hypothyroidism    Nephrolithiasis 06/06/2013   Non-ST elevation (NSTEMI) myocardial infarction (HCC) 12/24/2020   Patient Active Problem List   Diagnosis Date Noted   Coronary artery disease involving native coronary artery of native heart without angina pectoris 01/27/2022   Cough 03/01/2017   UTI (urinary tract infection) 06/06/2013   Nephrolithiasis 06/06/2013   Type II diabetes mellitus (HCC) 06/06/2013   Hyperlipidemia 06/06/2013   Hypothyroidism in adult 06/06/2013   Home Medication(s) Prior to Admission medications   Medication Sig Start Date End Date Taking? Authorizing Provider  Alogliptin Benzoate  12.5 MG TABS Take 12.5 mg by mouth daily. 11/18/20   [provider]  Cholecalciferol 50 MCG (2000 UT) TABS daily at 6 (six) AM. 11/10/21   [provider]  febuxostat (ULORIC) 40 MG tablet Take 40 mg by mouth daily.    [provider]  isosorbide mononitrate (IMDUR) 30 MG 24 hr tablet Take 1 tablet (30 mg total) by mouth daily. 01/29/23   Sharlene Dory, PA-C  JARDIANCE 10 MG TABS tablet Take 10 mg by mouth daily. 01/24/23   [provider]  levothyroxine (SYNTHROID) 125 MCG tablet Take 125 mcg by mouth daily before breakfast.    [provider]  metoprolol tartrate (LOPRESSOR) 25 MG tablet Take 0.5 tablets (12.5 mg total) by mouth 2 (two) times daily. 01/29/23   Sharlene Dory, PA-C  nitroGLYCERIN (NITROSTAT) 0.4 MG SL tablet Place 1 tablet (0.4 mg total) under the tongue every 5 (five) minutes x 3 doses as needed for chest pain. 01/29/23   Sharlene Dory, PA-C  tamsulosin (FLOMAX) 0.4 MG CAPS capsule Take 2 capsules by mouth at bedtime. 10/30/22   [provider]  Allergies Crestor [rosuvastatin] and Lipitor [atorvastatin]  Review of Systems Review of Systems As noted in HPI  Physical Exam Vital Signs  I have reviewed the triage vital signs BP (!) 145/73   Pulse 80   Temp 97.8 F (36.6 C)   Resp 20   Ht 5\' 10"  (1.778 m)   Wt 83.9 kg   SpO2 97%   BMI 26.54 kg/m   Physical Exam Vitals reviewed.  Constitutional:      General: He is not in acute distress.    Appearance: He is well-developed. He is not diaphoretic.  HENT:     Head: Normocephalic and atraumatic.     Nose: Nose normal.  Eyes:     General: No scleral icterus.       Right eye: No discharge.        Left eye: No discharge.     Conjunctiva/sclera: Conjunctivae normal.     Pupils: Pupils are equal, round, and reactive to light.   Cardiovascular:     Rate and Rhythm: Normal rate and regular rhythm.     Heart sounds: No murmur heard.    No friction rub. No gallop.  Pulmonary:     Effort: Pulmonary effort is normal. No respiratory distress.     Breath sounds: Normal breath sounds. No stridor. No rales.  Abdominal:     General: There is no distension.     Palpations: Abdomen is soft.     Tenderness: There is no abdominal tenderness.  Musculoskeletal:        General: No tenderness.     Cervical back: Normal range of motion and neck supple.  Skin:    General: Skin is warm and dry.     Findings: No erythema or rash.  Neurological:     Mental Status: He is alert and oriented to person, place, and time.     ED Results and Treatments Labs (all labs ordered are listed, but only abnormal results are displayed) Labs Reviewed  BASIC METABOLIC PANEL - Abnormal; Notable for the following components:      Result Value   Glucose, Bld 108 (*)    BUN 37 (*)    Creatinine, Ser 2.33 (*)    GFR, Estimated 28 (*)    All other components within normal limits  CBC - Abnormal; Notable for the following components:   Platelets 137 (*)    All other components within normal limits  TROPONIN I (HIGH SENSITIVITY)  TROPONIN I (HIGH SENSITIVITY)                                                                                                                         EKG  EKG Interpretation  Date/Time:  Sunday Apr 01 2023 22:31:11 EDT Ventricular Rate:  74 PR Interval:  256 QRS Duration: 124 QT Interval:  402 QTC Calculation: 446 R Axis:   -28 Text Interpretation: Sinus rhythm with sinus arrhythmia with 1st degree A-V block Right bundle branch block Abnormal  ECG When compared with ECG of 27-May-2021 20:05, no change Confirmed by Gilda Crease [id number]) on 04/02/2023 2:51:31 AM       Radiology DG Chest 2 View  Result Date: 04/01/2023 CLINICAL DATA:  Dizziness, palpitations, and recent blood pressure spikes. EXAM:  CHEST - 2 VIEW COMPARISON:  CTA chest 05/28/2021. FINDINGS: The lungs are hypoinflated but generally clear apart from scattered small calcified granulomas. No pleural effusion is seen.  Heart size and vasculature are normal. The mediastinum is normally outlined. There is calcification of the transverse aorta. The thoracic cage intact. There is multilevel anterior bridging enthesopathy of the thoracic spine. Mild osteopenia. IMPRESSION: 1. No active cardiopulmonary disease. 2. Aortic atherosclerosis. 3. Calcified granulomas. Electronically Signed   By: Almira Bar M.D.   On: 04/01/2023 23:16    Medications Ordered in ED Medications - No data to display Procedures Procedures  (including critical care time) Medical Decision Making / ED Course  Click here for ABCD2, HEART and other calculators  Medical Decision Making Amount and/or Complexity of Data Reviewed Labs: ordered. Radiology: ordered.   The patient presents to the ED for the following: Palpitations Chest discomfort  This involves an extensive number of treatment options, and is a complaint that carries with it a high risk of complications and morbidity. The differential diagnosis includes but not limited to:  Palpitations Will assess for any dysrhythmias, blocks.  Possible premature complexes.  Will assess for any electrolyte derangements Chest pain  Appears to be highly atypical for ACS but given the patient's previous cardiac history, will obtain workup to rule out ACS.  Feel his symptoms are more consistent with GERD.  I have low suspicion for pulmonary embolism, aortic dissection, pneumothorax, pneumonia.  Co-morbidities/SDOH that complicate the patient evaluation/care: Noted in HPI   Work up Interpretation and Management:  Cardiac Monitoring/EKG: EKG without acute ischemic changes, dysrhythmias or high-grade blocks.  Laboratory Tests ordered listed below with my independent interpretation: CBC without leukocytosis or  anemia BMP without significant electrolyte derangements.  Mildly worsening renal function when compared to a year ago. Serial troponins negative x 2.    Imaging Studies ordered listed below with my independent interpretation: Chest x-ray without evidence of pneumonia, pneumothorax, pulmonary edema or pleural effusion.  ED Course: Patient has been in the emergency department for approximately 8 hours. He has remained asymptomatic. Counseled on diet. Patient has appointment with cardiologist coming up this week. Already ordered Kardia monitor      Final Clinical Impression(s) / ED Diagnoses Final diagnoses:  Palpitations  Chest discomfort   The patient appears reasonably screened and/or stabilized for discharge and I doubt any other medical condition or other [facility] requiring further screening, evaluation, or treatment in the ED at this time. I have discussed the findings, Dx and Tx plan with the patient/family who expressed understanding and agree(s) with the plan. Discharge instructions discussed at length. The patient/family was given strict return precautions who verbalized understanding of the instructions. No further questions at time of discharge.  Disposition: Discharge  Condition: Good  ED Discharge Orders     None        Follow Up: [facility] [address] [place] [address] [phone]  Call  to schedule an appointment for close follow up  Sharlene Dory, PA-C [address] [place] [address] [phone]  Go to  as scheduled            This chart was dictated using voice recognition software.  Despite best efforts to proofread,  errors can occur which can change the documentation meaning.    Nira Conn, MD 04/02/23 [phone]

## 2023-04-05 ENCOUNTER — Encounter: Payer: Self-pay | Admitting: Physician Assistant

## 2023-04-05 ENCOUNTER — Ambulatory Visit: Payer: Medicare HMO | Attending: Physician Assistant | Admitting: Physician Assistant

## 2023-04-05 VITALS — BP 134/72 | HR 65 | Ht 70.0 in | Wt 192.2 lb

## 2023-04-05 DIAGNOSIS — I2510 Atherosclerotic heart disease of native coronary artery without angina pectoris: Secondary | ICD-10-CM

## 2023-04-05 DIAGNOSIS — R55 Syncope and collapse: Secondary | ICD-10-CM

## 2023-04-05 DIAGNOSIS — E785 Hyperlipidemia, unspecified: Secondary | ICD-10-CM | POA: Diagnosis not present

## 2023-04-05 DIAGNOSIS — N189 Chronic kidney disease, unspecified: Secondary | ICD-10-CM | POA: Diagnosis not present

## 2023-04-05 DIAGNOSIS — N400 Enlarged prostate without lower urinary tract symptoms: Secondary | ICD-10-CM

## 2023-04-05 NOTE — Progress Notes (Signed)
Office Visit    Patient Name: Victor Hutchinson. Date of Encounter: 04/05/2023  PCP:  [facility]   [facility]  Cardiologist:  Victor Schultz, MD  Advanced Practice Provider:  No care team member to display Electrophysiologist:  None   HPI    Victor Hutchinson. is a 77 y.o. male with a past medical history significant for CAD, diabetes, hyperlipidemia, hypertension presents today for follow-up appointment.  He was seen by Dr. Elease Hutchinson April 2022 for chest discomfort.  Previously had a cardiac catheterization February 2022.  Had successful PCI to mid LAD.  At his last follow-up appointment a year ago he was having upper sternal chest pain which was in the left of center which lasted about 1 minute.  Troponin negative in ER.  Nitroglycerin used.  Usually nothing would help other than Tylenol.  Heart attack pain was lower in location.  Statin intolerance and was unable to tolerate pravastatin.  LDL was 63.  Had some shortness of breath which she attributed to the Brilinta.  He stopped this medication over a year ago.  Mild bilateral carotid plaque.  He was seen by me 01/29/23, he told me that his chest pain was thought to be gallstones.  It occurs more in the center of her chest.  He was told he might have to have his gallbladder removed at some point.  He was started on Jardiance and he stopped a few of his other medications.  He states they were stopped due to his kidney disease.  One of his medications close he is asked.  He does experience some dizziness from time to time but this is resolving.  Likely due to multiple medication changes.  He does need a nitroglycerin refill.  He shares with me that his brother who is 83 years old just had triple bypass.  He drinks and smokes which hopefully he is going to  quit.  Today, he tells me that he felt so bad when he went to the ER his heart was pounding.  His heart rate would go from 40 elevated up to 90 bpm and felt  like it was pounding out of his chest.  Blood pressure was low and then all the way up to 200/100.  He states he felt like he was dying.  Troponin were negative for ischemic event.  The ER doctor told him he had GERD and he was told to avoid [employer].  Ever since then he has not had repeat of the symptoms.  He has had dizziness for years and was on Flomax for his prostate.  He discontinued this medication a few days ago as well as his metoprolol and he feels a lot better.  No dizziness.  No near syncope like he was having.  He tells me that he needs a referral today for a nephrologist and urologist as well as a primary care.  We reviewed his EKG and it appears that he has a new right bundle branch block.  We discussed pursuing a workup for more information.  He does have a strong family history of coronary artery disease and his brother just had a triple bypass.  He had a previous stent placed in the LAD back in 2022.  Reports no shortness of breath nor dyspnea on exertion. Reports no chest pain, pressure, or tightness. No edema, orthopnea, PND.    Past Medical History    Past Medical History:  Diagnosis Date   Coronary  artery disease    Diabetes mellitus without complication (HCC)    Gout 06/06/2013   Hyperlipidemia LDL goal <70 06/06/2013   Hypertension    Hypothyroidism    Nephrolithiasis 06/06/2013   Non-ST elevation (NSTEMI) myocardial infarction (HCC) 12/24/2020   Past Surgical History:  Procedure Laterality Date   CORONARY STENT PLACEMENT     CORONARY/GRAFT ACUTE MI REVASCULARIZATION N/A 12/24/2020   Procedure: Coronary/Graft Acute MI Revascularization;  Surgeon: Lennette Bihari, MD;  Location: [facility] INVASIVE CV LAB;  Service: Cardiovascular;  Laterality: N/A;   LEFT HEART CATH AND CORONARY ANGIOGRAPHY N/A 12/24/2020   Procedure: LEFT HEART CATH AND CORONARY ANGIOGRAPHY;  Surgeon: Lennette Bihari, MD;  Location: [facility] INVASIVE CV LAB;  Service: Cardiovascular;  Laterality: N/A;     Allergies  Allergies  Allergen Reactions   Crestor [Rosuvastatin]     Myalgias on 20mg  dosing   Lipitor [Atorvastatin]     Muscle aches, fatigue, and weakness     EKGs/Labs/Other Studies Reviewed:   The following studies were reviewed today:  Prior cardiac catheterization 2/21: Diagnostic Dominance: Right     Intervention        EKG:  EKG is  ordered today.  The ekg ordered today demonstrates normal sinus rhythm, rate 73 bpm, first-degree AV block with PAC  Recent Labs: 04/01/2023: BUN 37; Creatinine, Ser 2.33; Hemoglobin 13.9; Platelets 137; Potassium 4.3; Sodium 140  Recent Lipid Panel    Component Value Date/Time   CHOL 124 08/11/2021 0806   TRIG 219 (H) 08/11/2021 0806   HDL 25 (L) 08/11/2021 0806   CHOLHDL 5.0 08/11/2021 0806   CHOLHDL 8.3 12/24/2020 1259   VLDL UNABLE TO CALCULATE IF TRIGLYCERIDE OVER 400 mg/dL 16/08/9603 5409   LDLCALC 63 08/11/2021 0806   LDLDIRECT 142.4 (H) 12/24/2020 1259    Home Medications   Current Meds  Medication Sig   Alogliptin Benzoate 12.5 MG TABS Take 12.5 mg by mouth daily.   amLODipine (NORVASC) 5 MG tablet Take 5 mg by mouth daily.   Cholecalciferol 50 MCG (2000 UT) TABS daily at 6 (six) AM.   febuxostat (ULORIC) 40 MG tablet Take 40 mg by mouth daily.   glipiZIDE (GLUCOTROL) 5 MG tablet Take by mouth daily before breakfast.   isosorbide mononitrate (IMDUR) 30 MG 24 hr tablet Take 1 tablet (30 mg total) by mouth daily.   levothyroxine (SYNTHROID) 125 MCG tablet Take 125 mcg by mouth daily before breakfast.   losartan (COZAAR) 100 MG tablet Take 100 mg by mouth daily.   nitroGLYCERIN (NITROSTAT) 0.4 MG SL tablet Place 1 tablet (0.4 mg total) under the tongue every 5 (five) minutes x 3 doses as needed for chest pain.   [DISCONTINUED] metoprolol tartrate (LOPRESSOR) 25 MG tablet Take 0.5 tablets (12.5 mg total) by mouth 2 (two) times daily.   [DISCONTINUED] tamsulosin (FLOMAX) 0.4 MG CAPS capsule Take 2 capsules by  mouth at bedtime.     Review of Systems      All other systems reviewed and are otherwise negative except as noted above.  Physical Exam    VS:  BP 134/72   Pulse 65   Ht 5\' 10"  (1.778 m)   Wt 192 lb 3.2 oz (87.2 kg)   SpO2 97%   BMI 27.58 kg/m  , BMI Body mass index is 27.58 kg/m.  Wt Readings from Last 3 Encounters:  04/05/23 192 lb 3.2 oz (87.2 kg)  04/01/23 185 lb (83.9 kg)  01/29/23 196 lb (88.9 kg)  GEN: Well nourished, well developed, in no acute distress. HEENT: normal. Neck: Supple, no JVD, carotid bruits, or masses. Cardiac: RRR, no murmurs, rubs, or gallops. No clubbing, cyanosis, edema.  Radials/PT 2+ and equal bilaterally.  Respiratory:  Respirations regular and unlabored, clear to auscultation bilaterally. GI: Soft, nontender, nondistended. MS: No deformity or atrophy. Skin: Warm and dry, no rash. Neuro:  Strength and sensation are intact. Psych: Normal affect.  Assessment & Plan    Coronary artery disease -new RBBB today on EKG and strong FH of CAD with previous PCI -will order a cardiac PET/CT -PCI February 2022 -Continue current medication which includes Imdur 30 mg daily, Lopressor 12.5 mg daily, nitro as needed, Jardiance 10 mg daily -He recently discontinued his aspirin due to his kidney disease -No recent chest pain    Hyperlipidemia -His pravastatin by his primary.  LDL 63 HDL low 25, triglycerides 161 -repeat lipid panel ordered today     Disposition: Follow up 2 months with Victor Schultz, MD or APP.  Signed, Sharlene Dory, PA-C 04/05/2023, 3:46 PM [facility]

## 2023-04-05 NOTE — Patient Instructions (Addendum)
Medication Instructions:  Your physician recommends that you continue on your current medications as directed. Please refer to the Current Medication list given to you today.  *If you need a refill on your cardiac medications before your next appointment, please call your pharmacy*  Lab Work: Schedule lipids and lft's on a day that you can come fasting If you have labs (blood work) drawn today and your tests are completely normal, you will receive your results only by: MyChart Message (if you have MyChart) OR A paper copy in the mail If you have any lab test that is abnormal or we need to change your treatment, we will call you to review the results.  Testing/Procedures: How to Prepare for Your Cardiac PET/CT Stress Test:  1. Please do not take these medications before your test:   Medications that may interfere with the cardiac pharmacological stress agent (ex. nitrates - including erectile dysfunction medications, isosorbide mononitrate, tamulosin or beta-blockers) the day of the exam. (Erectile dysfunction medication should be held for at least 72 hrs prior to test) Theophylline containing medications for 12 hours. Dipyridamole 48 hours prior to the test. Your remaining medications may be taken with water.  2. Nothing to eat or drink, except water, 3 hours prior to arrival time.   NO caffeine/decaffeinated products, or chocolate 12 hours prior to arrival.  3. NO perfume, cologne or lotion  4. Total time is 1 to 2 hours; you may want to bring reading material for the waiting time.  5. Please report to Radiology at the [facility] Main Entrance 30 minutes early for your test.  [address] [place], [place] [address]  Diabetic Preparation:  Hold oral medications. You may take NPH and Lantus insulin. Do not take Humalog or Humulin R (Regular Insulin) the day of your test. Check blood sugars prior to leaving the house. If able to eat breakfast prior to 3 hour fasting,  you may take all medications, including your insulin, Do not worry if you miss your breakfast dose of insulin - start at your next meal.  IF YOU THINK YOU MAY BE PREGNANT, OR ARE NURSING PLEASE INFORM THE TECHNOLOGIST.  In preparation for your appointment, medication and supplies will be purchased.  Appointment availability is limited, so if you need to cancel or reschedule, please call the Radiology Department at [phone]  24 hours in advance to avoid a cancellation fee of $100.00  What to Expect After you Arrive:  Once you arrive and check in for your appointment, you will be taken to a preparation room within the Radiology Department.  A technologist or Nurse will obtain your medical history, verify that you are correctly prepped for the exam, and explain the procedure.  Afterwards,  an IV will be started in your arm and electrodes will be placed on your skin for EKG monitoring during the stress portion of the exam. Then you will be escorted to the PET/CT scanner.  There, staff will get you positioned on the scanner and obtain a blood pressure and EKG.  During the exam, you will continue to be connected to the EKG and blood pressure machines.  A small, safe amount of a radioactive tracer will be injected in your IV to obtain a series of pictures of your heart along with an injection of a stress agent.    After your Exam:  It is recommended that you eat a meal and drink a caffeinated beverage to counter act any effects of the stress  agent.  Drink plenty of fluids for the remainder of the day and urinate frequently for the first couple of hours after the exam.  Your doctor will inform you of your test results within 7-10 business days.  For questions about your test or how to prepare for your test, please call: Rockwell Alexandria, Cardiac Imaging Nurse Navigator  Larey Brick, Cardiac Imaging Nurse Navigator Office: [phone]   Follow-Up: At [facility], you and your health needs  are our priority.  As part of our continuing mission to provide you with exceptional heart care, we have created designated Provider Care Teams.  These Care Teams include your primary Cardiologist (physician) and Advanced Practice Providers (APPs -  Physician Assistants and Nurse Practitioners) who all work together to provide you with the care you need, when you need it.  Your next appointment:   2 month(s)  Provider:   Donato Schultz, MD or Kristeen Miss, MD  Other Instructions Check your blood pressure daily, 1 hr after morning medications for 2 weeks, keep a log and send [place] the readings through mychart at the end of the 2 weeks.   You have been referred to nephrology You have been referred to urology You have been referred to Christen Butter, DNP [phone]  Heart-Healthy Eating Plan Many factors influence your heart health, including eating and exercise habits. Heart health is also called coronary health. Coronary risk increases with abnormal blood fat (lipid) levels. A heart-healthy eating plan includes limiting unhealthy fats, increasing healthy fats, limiting salt (sodium) intake, and making other diet and lifestyle changes. What is my plan? Your health care provider may recommend that: You limit your fat intake to _________% or less of your total calories each day. You limit your saturated fat intake to _________% or less of your total calories each day. You limit the amount of cholesterol in your diet to less than _________ mg per day. You limit the amount of sodium in your diet to less than _________ mg per day. What are tips for following this plan? Cooking Cook foods using methods other than frying. Baking, boiling, grilling, and broiling are all good options. Other ways to reduce fat include: Removing the skin from poultry. Removing all visible fats from meats. Steaming vegetables in water or broth. Meal planning  At meals, imagine dividing your plate into fourths: Fill  one-half of your plate with vegetables and green salads. Fill one-fourth of your plate with whole grains. Fill one-fourth of your plate with lean protein foods. Eat 2-4 cups of vegetables per day. One cup of vegetables equals 1 cup (91 g) broccoli or cauliflower florets, 2 medium carrots, 1 large bell pepper, 1 large sweet potato, 1 large tomato, 1 medium white potato, 2 cups (150 g) raw leafy greens. Eat 1-2 cups of fruit per day. One cup of fruit equals 1 small apple, 1 large banana, 1 cup (237 g) mixed fruit, 1 large orange,  cup (82 g) dried fruit, 1 cup (240 mL) 100% fruit juice. Eat more foods that contain soluble fiber. Examples include apples, broccoli, carrots, beans, peas, and barley. Aim to get 25-30 g of fiber per day. Increase your consumption of legumes, nuts, and seeds to 4-5 servings per week. One serving of dried beans or legumes equals  cup (90 g) cooked, 1 serving of nuts is  oz (12 almonds, 24 pistachios, or 7 walnut halves), and 1 serving of seeds equals  oz (8 g). Fats Choose healthy fats more often. Choose monounsaturated and  polyunsaturated fats, such as olive and canola oils, avocado oil, flaxseeds, walnuts, almonds, and seeds. Eat more omega-3 fats. Choose salmon, mackerel, sardines, tuna, flaxseed oil, and ground flaxseeds. Aim to eat fish at least 2 times each week. Check food labels carefully to identify foods with trans fats or high amounts of saturated fat. Limit saturated fats. These are found in animal products, such as meats, butter, and cream. Plant sources of saturated fats include palm oil, palm kernel oil, and coconut oil. Avoid foods with partially hydrogenated oils in them. These contain trans fats. Examples are stick margarine, some tub margarines, cookies, crackers, and other baked goods. Avoid fried foods. General information Eat more home-cooked food and less restaurant, buffet, and fast food. Limit or avoid alcohol. Limit foods that are high in  added sugar and simple starches such as foods made using white refined flour (white breads, pastries, sweets). Lose weight if you are overweight. Losing just 5-10% of your body weight can help your overall health and prevent diseases such as diabetes and heart disease. Monitor your sodium intake, especially if you have high blood pressure. Talk with your health care provider about your sodium intake. Try to incorporate more vegetarian meals weekly. What foods should I eat? Fruits All fresh, canned (in natural juice), or frozen fruits. Vegetables Fresh or frozen vegetables (raw, steamed, roasted, or grilled). Green salads. Grains Most grains. Choose whole wheat and whole grains most of the time. Rice and pasta, including brown rice and pastas made with whole wheat. Meats and other proteins Lean, well-trimmed beef, veal, pork, and lamb. Chicken and [place] without skin. All fish and shellfish. Wild duck, rabbit, pheasant, and venison. Egg whites or low-cholesterol egg substitutes. Dried beans, peas, lentils, and tofu. Seeds and most nuts. Dairy Low-fat or nonfat cheeses, including ricotta and mozzarella. Skim or 1% milk (liquid, powdered, or evaporated). Buttermilk made with low-fat milk. Nonfat or low-fat yogurt. Fats and oils Non-hydrogenated (trans-free) margarines. Vegetable oils, including soybean, sesame, sunflower, olive, avocado, peanut, safflower, corn, canola, and cottonseed. Salad dressings or mayonnaise made with a vegetable oil. Beverages Water (mineral or sparkling). Coffee and tea. Unsweetened ice tea. Diet beverages. Sweets and desserts Sherbet, gelatin, and fruit ice. Small amounts of dark chocolate. Limit all sweets and desserts. Seasonings and condiments All seasonings and condiments. The items listed above may not be a complete list of foods and beverages you can eat. Contact a dietitian for more options. What foods should I avoid? Fruits Canned fruit in heavy syrup. Fruit  in cream or butter sauce. Fried fruit. Limit coconut. Vegetables Vegetables cooked in cheese, cream, or butter sauce. Fried vegetables. Grains Breads made with saturated or trans fats, oils, or whole milk. Croissants. Sweet rolls. Donuts. High-fat crackers, such as cheese crackers and chips. Meats and other proteins Fatty meats, such as hot dogs, ribs, sausage, bacon, rib-eye roast or steak. High-fat deli meats, such as salami and bologna. Caviar. Domestic duck and goose. Organ meats, such as liver. Dairy Cream, sour cream, cream cheese, and creamed cottage cheese. Whole-milk cheeses. Whole or 2% milk (liquid, evaporated, or condensed). Whole buttermilk. Cream sauce or high-fat cheese sauce. Whole-milk yogurt. Fats and oils Meat fat, or shortening. Cocoa butter, hydrogenated oils, palm oil, coconut oil, palm kernel oil. Solid fats and shortenings, including bacon fat, salt pork, lard, and butter. Nondairy cream substitutes. Salad dressings with cheese or sour cream. Beverages Regular sodas and any drinks with added sugar. Sweets and desserts Frosting. Pudding. Cookies. Cakes. Pies. Milk chocolate or  white chocolate. Buttered syrups. Full-fat ice cream or ice cream drinks. The items listed above may not be a complete list of foods and beverages to avoid. Contact a dietitian for more information. Summary Heart-healthy meal planning includes limiting unhealthy fats, increasing healthy fats, limiting salt (sodium) intake and making other diet and lifestyle changes. Lose weight if you are overweight. Losing just 5-10% of your body weight can help your overall health and prevent diseases such as diabetes and heart disease. Focus on eating a balance of foods, including fruits and vegetables, low-fat or nonfat dairy, lean protein, nuts and legumes, whole grains, and heart-healthy oils and fats. This information is not intended to replace advice given to you by your health care provider. Make sure you  discuss any questions you have with your health care provider. Document Revised: 12/05/2021 Document Reviewed: 12/05/2021 Elsevier Patient Education  2024 [employer]  Low-Sodium Eating Plan Salt (sodium) helps you keep a healthy balance of fluids in your body. Too much sodium can raise your blood pressure. It can also cause fluid and waste to be held in your body. Your health care provider or dietitian may recommend a low-sodium eating plan if you have high blood pressure (hypertension), kidney disease, liver disease, or heart failure. Eating less sodium can help lower your blood pressure and reduce swelling. It can also protect your heart, liver, and kidneys. What are tips for following this plan? Reading food labels  Check food labels for the amount of sodium per serving. If you eat more than one serving, you must multiply the listed amount by the number of servings. Choose foods with less than 140 milligrams (mg) of sodium per serving. Avoid foods with 300 mg of sodium or more per serving. Always check how much sodium is in a product, even if the label says "unsalted" or "no salt added." Shopping  Buy products labeled as "low-sodium" or "no salt added." Buy fresh foods. Avoid canned foods and pre-made or frozen meals. Avoid canned, cured, or processed meats. Buy breads that have less than 80 mg of sodium per slice. Cooking  Eat more home-cooked food. Try to eat less restaurant, buffet, and fast food. Try not to add salt when you cook. Use salt-free seasonings or herbs instead of table salt or sea salt. Check with your provider or pharmacist before using salt substitutes. Cook with plant-based oils, such as canola, sunflower, or olive oil. Meal planning When eating at a restaurant, ask if your food can be made with less salt or no salt. Avoid dishes labeled as brined, pickled, cured, or smoked. Avoid dishes made with soy sauce, miso, or teriyaki sauce. Avoid foods that have  monosodium glutamate (MSG) in them. MSG may be added to some restaurant food, sauces, soups, bouillon, and canned foods. Make meals that can be grilled, baked, poached, roasted, or steamed. These are often made with less sodium. General information Try to limit your sodium intake to 1,500-2,300 mg each day, or the amount told by your provider. What foods should I eat? Fruits Fresh, frozen, or canned fruit. Fruit juice. Vegetables Fresh or frozen vegetables. "No salt added" canned vegetables. "No salt added" tomato sauce and paste. Low-sodium or reduced-sodium tomato and vegetable juice. Grains Low-sodium cereals, such as oats, puffed wheat and rice, and shredded wheat. Low-sodium crackers. Unsalted rice. Unsalted pasta. Low-sodium bread. Whole grain breads and whole grain pasta. Meats and other proteins Fresh or frozen meat, poultry, seafood, and fish. These should have no added salt. Low-sodium canned  tuna and salmon. Unsalted nuts. Dried peas, beans, and lentils without added salt. Unsalted canned beans. Eggs. Unsalted nut butters. Dairy Milk. Soy milk. Cheese that is naturally low in sodium, such as ricotta cheese, fresh mozzarella, or Swiss cheese. Low-sodium or reduced-sodium cheese. Cream cheese. Yogurt. Seasonings and condiments Fresh and dried herbs and spices. Salt-free seasonings. Low-sodium mustard and ketchup. Sodium-free salad dressing. Sodium-free light mayonnaise. Fresh or refrigerated horseradish. Lemon juice. Vinegar. Other foods Homemade, reduced-sodium, or low-sodium soups. Unsalted popcorn and pretzels. Low-salt or salt-free chips. The items listed above may not be all the foods and drinks you can have. Talk to a dietitian to learn more. What foods should I avoid? Vegetables Sauerkraut, pickled vegetables, and relishes. Olives. [place] fries. Onion rings. Regular canned vegetables, except low-sodium or reduced-sodium items. Regular canned tomato sauce and paste. Regular  tomato and vegetable juice. Frozen vegetables in sauces. Grains Instant hot cereals. Bread stuffing, pancake, and biscuit mixes. Croutons. Seasoned rice or pasta mixes. Noodle soup cups. Boxed or frozen macaroni and cheese. Regular salted crackers. Self-rising flour. Meats and other proteins Meat or fish that is salted, canned, smoked, spiced, or pickled. Precooked or cured meat, such as sausages or meat loaves. Tomasa Blase. Ham. Pepperoni. Hot dogs. Corned beef. Chipped beef. Salt pork. Jerky. Pickled herring, anchovies, and sardines. Regular canned tuna. Salted nuts. Dairy Processed cheese and cheese spreads. Hard cheeses. Cheese curds. Blue cheese. Feta cheese. String cheese. Regular cottage cheese. Buttermilk. Canned milk. Fats and oils Salted butter. Regular margarine. Ghee. Bacon fat. Seasonings and condiments Onion salt, garlic salt, seasoned salt, table salt, and sea salt. Canned and packaged gravies. Worcestershire sauce. Tartar sauce. Barbecue sauce. Teriyaki sauce. Soy sauce, including reduced-sodium soy sauce. Steak sauce. Fish sauce. Oyster sauce. Cocktail sauce. Horseradish that you find on the shelf. Regular ketchup and mustard. Meat flavorings and tenderizers. Bouillon cubes. Hot sauce. Pre-made or packaged marinades. Pre-made or packaged taco seasonings. Relishes. Regular salad dressings. Salsa. Other foods Salted popcorn and pretzels. Corn chips and puffs. Potato and tortilla chips. Canned or dried soups. Pizza. Frozen entrees and pot pies. The items listed above may not be all the foods and drinks you should avoid. Talk to a dietitian to learn more. This information is not intended to replace advice given to you by your health care provider. Make sure you discuss any questions you have with your health care provider. Document Revised: 11/16/2022 Document Reviewed: 11/16/2022 Elsevier Patient Education  2024 [employer].

## 2023-04-11 ENCOUNTER — Ambulatory Visit: Payer: Medicare HMO | Attending: Cardiology

## 2023-04-11 DIAGNOSIS — I2510 Atherosclerotic heart disease of native coronary artery without angina pectoris: Secondary | ICD-10-CM | POA: Diagnosis not present

## 2023-04-11 DIAGNOSIS — E785 Hyperlipidemia, unspecified: Secondary | ICD-10-CM | POA: Diagnosis not present

## 2023-04-12 LAB — HEPATIC FUNCTION PANEL
ALT: 16 IU/L (ref 0–44)
AST: 19 IU/L (ref 0–40)
Albumin: 4.2 g/dL (ref 3.8–4.8)
Alkaline Phosphatase: 110 IU/L (ref 44–121)
Bilirubin Total: 0.4 mg/dL (ref 0.0–1.2)
Bilirubin, Direct: 0.13 mg/dL (ref 0.00–0.40)
Total Protein: 7.5 g/dL (ref 6.0–8.5)

## 2023-04-12 LAB — LIPID PANEL
Chol/HDL Ratio: 7.4 ratio — ABNORMAL HIGH (ref 0.0–5.0)
Cholesterol, Total: 199 mg/dL (ref 100–199)
HDL: 27 mg/dL — ABNORMAL LOW (ref >39)
LDL Chol Calc (NIH): 125 mg/dL — ABNORMAL HIGH (ref 0–99)
Triglycerides: 262 mg/dL — ABNORMAL HIGH (ref 0–149)
VLDL Cholesterol Cal: 47 mg/dL — ABNORMAL HIGH (ref 5–40)

## 2023-04-13 ENCOUNTER — Other Ambulatory Visit: Payer: Self-pay | Admitting: *Deleted

## 2023-04-13 MED ORDER — EZETIMIBE 10 MG PO TABS: 10.0000 mg | ORAL_TABLET | Freq: Every day | ORAL | 3 refills | Status: DC

## 2023-04-13 NOTE — Progress Notes (Signed)
Rx for ezetimibe 10 mg sent in per result note/request from pt via mychart msg.

## 2023-05-25 DIAGNOSIS — N401 Enlarged prostate with lower urinary tract symptoms: Secondary | ICD-10-CM | POA: Diagnosis not present

## 2023-05-25 DIAGNOSIS — R3915 Urgency of urination: Secondary | ICD-10-CM | POA: Diagnosis not present

## 2023-05-25 DIAGNOSIS — R3912 Poor urinary stream: Secondary | ICD-10-CM | POA: Diagnosis not present

## 2023-05-25 DIAGNOSIS — R351 Nocturia: Secondary | ICD-10-CM | POA: Diagnosis not present

## 2023-06-04 DIAGNOSIS — N184 Chronic kidney disease, stage 4 (severe): Secondary | ICD-10-CM | POA: Diagnosis not present

## 2023-06-04 DIAGNOSIS — I129 Hypertensive chronic kidney disease with stage 1 through stage 4 chronic kidney disease, or unspecified chronic kidney disease: Secondary | ICD-10-CM | POA: Diagnosis not present

## 2023-06-04 DIAGNOSIS — D631 Anemia in chronic kidney disease: Secondary | ICD-10-CM | POA: Diagnosis not present

## 2023-06-04 DIAGNOSIS — N2581 Secondary hyperparathyroidism of renal origin: Secondary | ICD-10-CM | POA: Diagnosis not present

## 2023-06-04 DIAGNOSIS — N189 Chronic kidney disease, unspecified: Secondary | ICD-10-CM | POA: Diagnosis not present

## 2023-06-06 ENCOUNTER — Other Ambulatory Visit: Payer: Self-pay | Admitting: Nephrology

## 2023-06-06 DIAGNOSIS — N184 Chronic kidney disease, stage 4 (severe): Secondary | ICD-10-CM

## 2023-06-12 ENCOUNTER — Ambulatory Visit
Admission: RE | Admit: 2023-06-12 | Discharge: 2023-06-12 | Disposition: A | Discharge status: Home / Self Care | Payer: Medicare HMO | Source: Ambulatory Visit | Attending: Nephrology | Admitting: Nephrology

## 2023-06-12 DIAGNOSIS — N200 Calculus of kidney: Secondary | ICD-10-CM | POA: Diagnosis not present

## 2023-06-12 DIAGNOSIS — N189 Chronic kidney disease, unspecified: Secondary | ICD-10-CM | POA: Diagnosis not present

## 2023-06-12 DIAGNOSIS — N184 Chronic kidney disease, stage 4 (severe): Secondary | ICD-10-CM

## 2023-06-25 ENCOUNTER — Encounter (HOSPITAL_COMMUNITY): Payer: Self-pay

## 2023-06-25 ENCOUNTER — Telehealth (HOSPITAL_COMMUNITY): Payer: Self-pay | Admitting: *Deleted

## 2023-06-25 NOTE — Telephone Encounter (Signed)
Attempted to call patient regarding upcoming cardiac PET appointment. Left message on voicemail with name and callback number  Larey Brick RN Navigator Cardiac Imaging [facility] [phone] Office [phone] Cell  Reminder to avoid Imdur and caffeine before cardiac PET scan.

## 2023-06-27 ENCOUNTER — Encounter (HOSPITAL_COMMUNITY)
Admission: RE | Admit: 2023-06-27 | Discharge: 2023-06-27 | Disposition: A | Discharge status: Home / Self Care | Payer: Medicare HMO | Source: Ambulatory Visit | Attending: Physician Assistant | Admitting: Physician Assistant

## 2023-06-27 DIAGNOSIS — I2510 Atherosclerotic heart disease of native coronary artery without angina pectoris: Secondary | ICD-10-CM | POA: Diagnosis not present

## 2023-06-27 DIAGNOSIS — R55 Syncope and collapse: Secondary | ICD-10-CM | POA: Diagnosis not present

## 2023-06-27 LAB — NM PET CT CARDIAC PERFUSION MULTI W/ABSOLUTE BLOODFLOW
LV dias vol: 77 mL (ref 62–150)
MBFR: 2.11
Nuc Rest EF: 66 %
Nuc Stress EF: 73 %
Peak HR: 117 {beats}/min
Rest HR: 74 {beats}/min
Rest MBF: 0.98 ml/g/min
Rest Nuclear Isotope Dose: 22.5 mCi
Rest perfusion cavity size (mL): 77 mL
ST Depression (mm): 0 mm
Stress MBF: 2.07 ml/g/min
Stress Nuclear Isotope Dose: 22.8 mCi
Stress perfusion cavity size (mL): 66 mL
TID: 0.74

## 2023-06-27 MED ORDER — RUBIDIUM RB82 GENERATOR (RUBYFILL)
22.5000 | PACK | Freq: Once | INTRAVENOUS | Status: AC
  Administered 2023-06-27: 22.5 via INTRAVENOUS

## 2023-06-27 MED ORDER — REGADENOSON 0.4 MG/5ML IV SOLN
0.4000 mg | Freq: Once | INTRAVENOUS | Status: AC
  Administered 2023-06-27: 0.4 mg via INTRAVENOUS

## 2023-06-27 MED ORDER — RUBIDIUM RB82 GENERATOR (RUBYFILL)
22.8000 | PACK | Freq: Once | INTRAVENOUS | Status: AC
  Administered 2023-06-27: 22.8 via INTRAVENOUS

## 2023-06-27 MED ORDER — REGADENOSON 0.4 MG/5ML IV SOLN
INTRAVENOUS | Status: AC
  Filled 2023-06-27: qty 5

## 2023-07-09 DIAGNOSIS — M546 Pain in thoracic spine: Secondary | ICD-10-CM | POA: Diagnosis not present

## 2023-07-09 DIAGNOSIS — I700 Atherosclerosis of aorta: Secondary | ICD-10-CM | POA: Diagnosis not present

## 2023-08-06 DIAGNOSIS — R3915 Urgency of urination: Secondary | ICD-10-CM | POA: Diagnosis not present

## 2023-08-06 DIAGNOSIS — R3912 Poor urinary stream: Secondary | ICD-10-CM | POA: Diagnosis not present

## 2023-08-06 DIAGNOSIS — R351 Nocturia: Secondary | ICD-10-CM | POA: Diagnosis not present

## 2023-08-15 DIAGNOSIS — R3915 Urgency of urination: Secondary | ICD-10-CM | POA: Diagnosis not present

## 2023-08-15 DIAGNOSIS — R3912 Poor urinary stream: Secondary | ICD-10-CM | POA: Diagnosis not present

## 2023-08-15 DIAGNOSIS — R351 Nocturia: Secondary | ICD-10-CM | POA: Diagnosis not present

## 2023-08-15 DIAGNOSIS — N401 Enlarged prostate with lower urinary tract symptoms: Secondary | ICD-10-CM | POA: Diagnosis not present

## 2023-08-22 DIAGNOSIS — D631 Anemia in chronic kidney disease: Secondary | ICD-10-CM | POA: Diagnosis not present

## 2023-08-22 DIAGNOSIS — N1832 Chronic kidney disease, stage 3b: Secondary | ICD-10-CM | POA: Diagnosis not present

## 2023-08-22 DIAGNOSIS — I129 Hypertensive chronic kidney disease with stage 1 through stage 4 chronic kidney disease, or unspecified chronic kidney disease: Secondary | ICD-10-CM | POA: Diagnosis not present

## 2023-08-22 DIAGNOSIS — N2581 Secondary hyperparathyroidism of renal origin: Secondary | ICD-10-CM | POA: Diagnosis not present

## 2023-09-18 ENCOUNTER — Encounter (INDEPENDENT_AMBULATORY_CARE_PROVIDER_SITE_OTHER): Payer: Self-pay

## 2023-10-22 DIAGNOSIS — N401 Enlarged prostate with lower urinary tract symptoms: Secondary | ICD-10-CM | POA: Diagnosis not present

## 2023-10-22 DIAGNOSIS — R351 Nocturia: Secondary | ICD-10-CM | POA: Diagnosis not present

## 2023-11-05 DIAGNOSIS — N3000 Acute cystitis without hematuria: Secondary | ICD-10-CM | POA: Diagnosis not present

## 2023-11-05 DIAGNOSIS — R8271 Bacteriuria: Secondary | ICD-10-CM | POA: Diagnosis not present

## 2023-11-27 DIAGNOSIS — R3915 Urgency of urination: Secondary | ICD-10-CM | POA: Diagnosis not present

## 2023-11-27 DIAGNOSIS — R8271 Bacteriuria: Secondary | ICD-10-CM | POA: Diagnosis not present

## 2023-11-27 DIAGNOSIS — R351 Nocturia: Secondary | ICD-10-CM | POA: Diagnosis not present

## 2023-12-17 DIAGNOSIS — N1832 Chronic kidney disease, stage 3b: Secondary | ICD-10-CM | POA: Diagnosis not present

## 2023-12-27 DIAGNOSIS — N2581 Secondary hyperparathyroidism of renal origin: Secondary | ICD-10-CM | POA: Diagnosis not present

## 2023-12-27 DIAGNOSIS — N1832 Chronic kidney disease, stage 3b: Secondary | ICD-10-CM | POA: Diagnosis not present

## 2023-12-27 DIAGNOSIS — N189 Chronic kidney disease, unspecified: Secondary | ICD-10-CM | POA: Diagnosis not present

## 2023-12-27 DIAGNOSIS — D631 Anemia in chronic kidney disease: Secondary | ICD-10-CM | POA: Diagnosis not present

## 2023-12-27 DIAGNOSIS — I129 Hypertensive chronic kidney disease with stage 1 through stage 4 chronic kidney disease, or unspecified chronic kidney disease: Secondary | ICD-10-CM | POA: Diagnosis not present

## 2024-01-08 ENCOUNTER — Other Ambulatory Visit: Payer: Self-pay | Admitting: Physician Assistant

## 2024-01-15 ENCOUNTER — Other Ambulatory Visit: Payer: Self-pay | Admitting: Physician Assistant

## 2024-03-26 DIAGNOSIS — R351 Nocturia: Secondary | ICD-10-CM | POA: Diagnosis not present

## 2024-03-26 DIAGNOSIS — R300 Dysuria: Secondary | ICD-10-CM | POA: Diagnosis not present

## 2024-03-26 DIAGNOSIS — N401 Enlarged prostate with lower urinary tract symptoms: Secondary | ICD-10-CM | POA: Diagnosis not present

## 2024-05-01 DIAGNOSIS — N3000 Acute cystitis without hematuria: Secondary | ICD-10-CM | POA: Diagnosis not present

## 2024-05-01 DIAGNOSIS — N401 Enlarged prostate with lower urinary tract symptoms: Secondary | ICD-10-CM | POA: Diagnosis not present

## 2024-05-01 DIAGNOSIS — R300 Dysuria: Secondary | ICD-10-CM | POA: Diagnosis not present

## 2024-06-23 DIAGNOSIS — N2581 Secondary hyperparathyroidism of renal origin: Secondary | ICD-10-CM | POA: Diagnosis not present

## 2024-06-23 DIAGNOSIS — D631 Anemia in chronic kidney disease: Secondary | ICD-10-CM | POA: Diagnosis not present

## 2024-06-23 DIAGNOSIS — N1832 Chronic kidney disease, stage 3b: Secondary | ICD-10-CM | POA: Diagnosis not present

## 2024-06-23 DIAGNOSIS — N189 Chronic kidney disease, unspecified: Secondary | ICD-10-CM | POA: Diagnosis not present

## 2024-06-23 DIAGNOSIS — I129 Hypertensive chronic kidney disease with stage 1 through stage 4 chronic kidney disease, or unspecified chronic kidney disease: Secondary | ICD-10-CM | POA: Diagnosis not present

## 2024-08-09 ENCOUNTER — Encounter (HOSPITAL_COMMUNITY): Payer: Self-pay | Admitting: Pharmacy Technician

## 2024-08-09 ENCOUNTER — Emergency Department (HOSPITAL_COMMUNITY)

## 2024-08-09 ENCOUNTER — Emergency Department (HOSPITAL_COMMUNITY)
Admission: EM | Admit: 2024-08-09 | Discharge: 2024-08-09 | Disposition: A | Discharge status: Home / Self Care | Attending: Emergency Medicine | Admitting: Emergency Medicine

## 2024-08-09 ENCOUNTER — Other Ambulatory Visit: Payer: Self-pay

## 2024-08-09 DIAGNOSIS — K219 Gastro-esophageal reflux disease without esophagitis: Secondary | ICD-10-CM | POA: Insufficient documentation

## 2024-08-09 DIAGNOSIS — R7989 Other specified abnormal findings of blood chemistry: Secondary | ICD-10-CM | POA: Diagnosis not present

## 2024-08-09 DIAGNOSIS — Z79899 Other long term (current) drug therapy: Secondary | ICD-10-CM | POA: Insufficient documentation

## 2024-08-09 DIAGNOSIS — R0789 Other chest pain: Secondary | ICD-10-CM

## 2024-08-09 DIAGNOSIS — Z7984 Long term (current) use of oral hypoglycemic drugs: Secondary | ICD-10-CM | POA: Diagnosis not present

## 2024-08-09 DIAGNOSIS — E1165 Type 2 diabetes mellitus with hyperglycemia: Secondary | ICD-10-CM | POA: Insufficient documentation

## 2024-08-09 DIAGNOSIS — I10 Essential (primary) hypertension: Secondary | ICD-10-CM | POA: Diagnosis not present

## 2024-08-09 DIAGNOSIS — R079 Chest pain, unspecified: Secondary | ICD-10-CM | POA: Diagnosis present

## 2024-08-09 LAB — CBC
HCT: 45.6 % (ref 39.0–52.0)
Hemoglobin: 15.2 g/dL (ref 13.0–17.0)
MCH: 29.7 pg (ref 26.0–34.0)
MCHC: 33.3 g/dL (ref 30.0–36.0)
MCV: 89.1 fL (ref 80.0–100.0)
Platelets: 152 K/uL (ref 150–400)
RBC: 5.12 MIL/uL (ref 4.22–5.81)
RDW: 13.1 % (ref 11.5–15.5)
WBC: 4.7 K/uL (ref 4.0–10.5)
nRBC: 0 % (ref 0.0–0.2)

## 2024-08-09 LAB — TROPONIN I (HIGH SENSITIVITY): Troponin I (High Sensitivity): 8 ng/L (ref <18)

## 2024-08-09 LAB — BASIC METABOLIC PANEL WITH GFR
Anion gap: 13 (ref 5–15)
BUN: 23 mg/dL (ref 8–23)
CO2: 22 mmol/L (ref 22–32)
Calcium: 10.1 mg/dL (ref 8.9–10.3)
Chloride: 108 mmol/L (ref 98–111)
Creatinine, Ser: 1.76 mg/dL — ABNORMAL HIGH (ref 0.61–1.24)
GFR, Estimated: 39 mL/min — ABNORMAL LOW (ref >60)
Glucose, Bld: 169 mg/dL — ABNORMAL HIGH (ref 70–99)
Potassium: 4.5 mmol/L (ref 3.5–5.1)
Sodium: 143 mmol/L (ref 135–145)

## 2024-08-09 MED ORDER — LIDOCAINE VISCOUS HCL 2 % MT SOLN
15.0000 mL | Freq: Once | OROMUCOSAL | Status: AC
  Administered 2024-08-09: 15 mL via ORAL
  Filled 2024-08-09: qty 15

## 2024-08-09 MED ORDER — MAALOX MAX 400-400-40 MG/5ML PO SUSP: 10.0000 mL | Freq: Four times a day (QID) | ORAL | 0 refills | Status: AC | PRN

## 2024-08-09 MED ORDER — PANTOPRAZOLE SODIUM 40 MG PO TBEC
40.0000 mg | DELAYED_RELEASE_TABLET | Freq: Once | ORAL | Status: AC
  Administered 2024-08-09: 40 mg via ORAL
  Filled 2024-08-09: qty 1

## 2024-08-09 MED ORDER — ALUM & MAG HYDROXIDE-SIMETH 200-200-20 MG/5ML PO SUSP
30.0000 mL | Freq: Once | ORAL | Status: AC
  Administered 2024-08-09: 30 mL via ORAL
  Filled 2024-08-09: qty 30

## 2024-08-09 NOTE — ED Provider Notes (Signed)
 [facility] Provider Note   CSN: [id number] Arrival date & time: 08/09/24  1352     Patient presents with: Chest Pain   Victor Bulman. is a 79 y.o. male with past medical history sniffer hypertension, hyperlipidemia, diabetes, previous ACS with stent in place in LAD who presents with concern for what he describes as chest burning sensation for around 1 week.  He reports no improvement with Tums or esomeprazole.  He does endorse starting a new cholesterol medication which is injectable.  Denies any other medication changes.  He reports the symptoms are not exertional in nature.  He denies any nausea, vomiting, radiation of the pain.  He reports that it is mostly located in the upper chest.  He does report that he has had some gallstones in the past, but that he is not having any abdominal pain at this time.    Chest Pain      Prior to Admission medications   Medication Sig Start Date End Date Taking? Authorizing Provider  alum & mag hydroxide-simeth (MAALOX MAX) 400-400-40 MG/5ML suspension Take 10 mLs by mouth every 6 (six) hours as needed for indigestion. 08/09/24  Yes Fountain Derusha H, PA-C  Alogliptin Benzoate 12.5 MG TABS Take 12.5 mg by mouth daily. 11/18/20   [provider]  amLODipine  (NORVASC ) 5 MG tablet Take 5 mg by mouth daily.    [provider]  Cholecalciferol 50 MCG (2000 UT) TABS daily at 6 (six) AM. 11/10/21   [provider]  ezetimibe  (ZETIA ) 10 MG tablet Take 1 tablet (10 mg total) by mouth daily. 04/13/23   Lucien Orren SAILOR, PA-C  febuxostat  (ULORIC ) 40 MG tablet Take 40 mg by mouth daily.    [provider]  glipiZIDE (GLUCOTROL) 5 MG tablet Take by mouth daily before breakfast.    [provider]  isosorbide  mononitrate (IMDUR ) 30 MG 24 hr tablet TAKE 1 TABLET BY MOUTH EVERY DAY 01/08/24   Jeffrie Oneil BROCKS, MD  levothyroxine  (SYNTHROID ) 125 MCG tablet Take 125 mcg by mouth  daily before breakfast.    [provider]  losartan (COZAAR) 100 MG tablet Take 100 mg by mouth daily.    [provider]  nitroGLYCERIN  (NITROSTAT ) 0.4 MG SL tablet Place 1 tablet (0.4 mg total) under the tongue every 5 (five) minutes x 3 doses as needed for chest pain. 01/29/23   Lucien Orren SAILOR, PA-C    Allergies: Crestor  [rosuvastatin ] and Lipitor [atorvastatin]    Review of Systems  Cardiovascular:  Positive for chest pain.  All other systems reviewed and are negative.   Updated Vital Signs BP (!) 150/73   Pulse 76   Temp 98.2 F (36.8 C)   Resp 16   SpO2 100%   Physical Exam Vitals and nursing note reviewed.  Constitutional:      General: He is not in acute distress.    Appearance: Normal appearance.  HENT:     Head: Normocephalic and atraumatic.  Eyes:     General:        Right eye: No discharge.        Left eye: No discharge.  Cardiovascular:     Rate and Rhythm: Normal rate and regular rhythm.     Heart sounds: No murmur heard.    No friction rub. No gallop.  Pulmonary:     Effort: Pulmonary effort is normal.     Breath sounds: Normal breath sounds.  Comments: No wheezing, rhonchi, stridor, rales Abdominal:     General: Bowel sounds are normal.     Palpations: Abdomen is soft.     Comments: No tenderness to palpation of the abdomen  Skin:    General: Skin is warm and dry.     Capillary Refill: Capillary refill takes less than 2 seconds.  Neurological:     Mental Status: He is alert and oriented to person, place, and time.  Psychiatric:        Mood and Affect: Mood normal.        Behavior: Behavior normal.     (all labs ordered are listed, but only abnormal results are displayed) Labs Reviewed  BASIC METABOLIC PANEL WITH GFR - Abnormal; Notable for the following components:      Result Value   Glucose, Bld 169 (*)    Creatinine, Ser 1.76 (*)    GFR, Estimated 39 (*)    All other components within normal limits  CBC  TROPONIN I  (HIGH SENSITIVITY)    EKG: EKG Interpretation Date/Time:  Saturday August 09 2024 14:05:10 EDT Ventricular Rate:  85 PR Interval:  234 QRS Duration:  128 QT Interval:  374 QTC Calculation: 445 R Axis:   -10  Text Interpretation: Sinus rhythm with 1st degree A-V block Right bundle branch block Abnormal ECG When compared with ECG of 01-Apr-2023 22:31, No significant change since last tracing Confirmed by Randol Simmonds [phone]) on 08/09/2024 2:31:56 PM  Radiology: DG Chest 2 View Result Date: 08/09/2024 CLINICAL DATA:  Chest pain. EXAM: CHEST - 2 VIEW COMPARISON:  04/01/2023. FINDINGS: The heart size and mediastinal contours are within normal limits. There is atherosclerotic calcification of the aorta. No consolidation, effusion, or pneumothorax is seen. Degenerative changes are noted in the thoracic spine. No acute osseous abnormality. IMPRESSION: No active cardiopulmonary disease. Electronically Signed   By: Leita Birmingham M.D.   On: 08/09/2024 15:05     Procedures   Medications Ordered in the ED  alum & mag hydroxide-simeth (MAALOX/MYLANTA) 200-200-20 MG/5ML suspension 30 mL (30 mLs Oral Given 08/09/24 1446)    And  lidocaine  (XYLOCAINE ) 2 % viscous mouth solution 15 mL (15 mLs Oral Given 08/09/24 1446)  pantoprazole (PROTONIX) EC tablet 40 mg (40 mg Oral Given 08/09/24 1447)    Clinical Course as of 08/09/24 1511  Sat Aug 09, 2024  1503 Hx ACS with LAD infarction 2022 s/p PCI. ~1 week epigastric burning not exertional. [facility] concerned about [facility]. Known RBBB. Troponin 8. Home PPI inconsistently. [ ]  GI cocktail --> reassess [WB]    Clinical Course User Index [WB] Beam, Elsie, MD                                 Medical Decision Making Amount and/or Complexity of Data Reviewed Labs: ordered. Radiology: ordered.  Risk OTC drugs. Prescription drug management.   This patient is a 78 y.o. male  who presents to the ED for concern of chest pain / heartburn sensation.    Differential diagnoses prior to evaluation: The emergent differential diagnosis includes, but is not limited to,  ACS, AAS, PE, Mallory-Weiss, Boerhaave's, Pneumonia, acute bronchitis, asthma or COPD exacerbation, anxiety, MSK pain or traumatic injury to the chest, acid reflux versus other . This is not an exhaustive differential.   Past Medical History / Co-morbidities / Social History: HTN, HLD, ACD, DM2  Additional history: Chart reviewed. Pertinent results include: reviewed  CT cardiac perfusion scan, previous echo from 2022  Physical Exam: Physical exam performed. The pertinent findings include: Patient overall well-appearing, mild hypertension, blood pressure 159/75.  Vital signs otherwise stable.  Clear lung sounds, no tenderness to palpation of the abdomen or chest wall.  Lab Tests/Imaging studies: I personally interpreted labs/imaging and the pertinent results include: CBC unremarkable, independently interpreted plain film chest x-ray which shows no evidence of acute intrathoracic abnormality.  Initial troponin normal at 8, BMP with mild hyperglycemia, glucose 169, his creatinine is elevated 1.76 which is overall slightly improved compared to his baseline.  Pending at time of handoff. I agree with the radiologist interpretation.  Cardiac monitoring: EKG obtained and interpreted by myself and attending physician which shows:  Normal sinus rhythm, right bundle branch block, not significantly changed from baseline  Medications: I ordered medication including GI cocktail, Protonix for suspected GERD etiology.  I have reviewed the patients home medicines and have made adjustments as needed. Patient feeling improved after GI cocktail, his description of his chest burning is not suspicious for acute cardiac pathology, I think it would be reasonable for him to follow-up with GI, encouraged Maalox Mylanta, continued esomeprazole.  Return precautions given.  Final diagnoses:  Atypical chest  pain  Gastroesophageal reflux disease, unspecified whether esophagitis present    ED Discharge Orders          Ordered    alum & mag hydroxide-simeth (MAALOX MAX) 400-400-40 MG/5ML suspension  Every 6 hours PRN        08/09/24 1511               Jeffery Gammell, Masury H, PA-C 08/09/24 1511    Randol Simmonds, MD 08/10/24 [phone]

## 2024-08-09 NOTE — ED Triage Notes (Signed)
 Pt sent here from [place] for possible MI.  States extreme heart burn x 1 week.

## 2024-08-09 NOTE — Discharge Instructions (Addendum)
 Your workup today was reassuring, I do suspect that you are having some acid reflux symptoms versus a developing ulcer.  You can follow-up with your cardiologist as we discussed.  I would talk to the [place] about getting a referral for a GI doctor for an endoscopy.  In the meantime in addition to taking the esomeprazole daily as we discussed you can try over-the-counter Maalox, Mylanta when you are having symptoms.

## 2024-08-14 DIAGNOSIS — H35371 Puckering of macula, right eye: Secondary | ICD-10-CM | POA: Diagnosis not present

## 2024-08-14 DIAGNOSIS — E119 Type 2 diabetes mellitus without complications: Secondary | ICD-10-CM | POA: Diagnosis not present

## 2024-08-14 DIAGNOSIS — H04123 Dry eye syndrome of bilateral lacrimal glands: Secondary | ICD-10-CM | POA: Diagnosis not present

## 2024-08-14 DIAGNOSIS — H43811 Vitreous degeneration, right eye: Secondary | ICD-10-CM | POA: Diagnosis not present

## 2024-08-14 DIAGNOSIS — H43822 Vitreomacular adhesion, left eye: Secondary | ICD-10-CM | POA: Diagnosis not present

## 2024-08-14 DIAGNOSIS — H43821 Vitreomacular adhesion, right eye: Secondary | ICD-10-CM | POA: Diagnosis not present

## 2024-08-14 DIAGNOSIS — Z961 Presence of intraocular lens: Secondary | ICD-10-CM | POA: Diagnosis not present

## 2024-08-14 DIAGNOSIS — H538 Other visual disturbances: Secondary | ICD-10-CM | POA: Diagnosis not present

## 2024-09-26 LAB — LAB REPORT - SCANNED
A1c: 7.2
Creatinine, POC: 180.4 mg/dL
EGFR: 38
Free T4: 1.21 ng/dL
Microalb Creat Ratio: 42.7
Microalbumin, Urine: 7.7
TSH: 1.57 (ref 0.41–5.90)

## 2024-10-07 ENCOUNTER — Encounter: Payer: Self-pay | Admitting: Physician Assistant

## 2024-10-07 ENCOUNTER — Ambulatory Visit: Payer: Self-pay | Attending: Physician Assistant | Admitting: Physician Assistant

## 2024-10-07 VITALS — BP 146/80 | HR 70 | Ht 70.0 in | Wt 183.0 lb

## 2024-10-07 DIAGNOSIS — Z955 Presence of coronary angioplasty implant and graft: Secondary | ICD-10-CM

## 2024-10-07 DIAGNOSIS — I2510 Atherosclerotic heart disease of native coronary artery without angina pectoris: Secondary | ICD-10-CM

## 2024-10-07 DIAGNOSIS — E785 Hyperlipidemia, unspecified: Secondary | ICD-10-CM

## 2024-10-07 NOTE — Patient Instructions (Signed)
 Medication Instructions:  Your physician recommends that you continue on your current medications as directed. Please refer to the Current Medication list given to you today. *If you need a refill on your cardiac medications before your next appointment, please call your pharmacy*  Lab Work: None ordered If you have labs (blood work) drawn today and your tests are completely normal, you will receive your results only by: MyChart Message (if you have MyChart) OR A paper copy in the mail If you have any lab test that is abnormal or we need to change your treatment, we will call you to review the results.  Testing/Procedures: None ordered  Follow-Up: At [facility], you and your health needs are our priority.  As part of our continuing mission to provide you with exceptional heart care, our providers are all part of one team.  This team includes your primary Cardiologist (physician) and Advanced Practice Providers or APPs (Physician Assistants and Nurse Practitioners) who all work together to provide you with the care you need, when you need it.  Your next appointment:   12 month(s)  Provider:   Oneil Parchment, MD    We recommend signing up for the patient portal called MyChart.  Sign up information is provided on this After Visit Summary.  MyChart is used to connect with patients for Virtual Visits (Telemedicine).  Patients are able to view lab/test results, encounter notes, upcoming appointments, etc.  Non-urgent messages can be sent to your provider as well.   To learn more about what you can do with MyChart, go to [email or web address].   Other Instructions

## 2024-10-07 NOTE — Progress Notes (Signed)
 Office Visit    Patient Name: Victor Hutchinson. Date of Encounter: 10/07/2024  PCP:  [facility]  Cardiologist:  Oneil Parchment, MD  Advanced Practice Provider:  No care team member to display Electrophysiologist:  None   HPI    Victor Hutchinson. is a 78 y.o. male with a past medical history significant for CAD, diabetes, hyperlipidemia, hypertension presents today for follow-up appointment.  He was seen by Dr. Alveta April 2022 for chest discomfort.  Previously had a cardiac catheterization February 2022.  Had successful PCI to mid LAD.  At his last follow-up appointment a year ago he was having upper sternal chest pain which was in the left of center which lasted about 1 minute.  Troponin negative in ER.  Nitroglycerin  used.  Usually nothing would help other than Tylenol .  Heart attack pain was lower in location.  Statin intolerance and was unable to tolerate pravastatin .  LDL was 63.  Had some shortness of breath which she attributed to the Brilinta .  He stopped this medication over a year ago.  Mild bilateral carotid plaque.  He was seen by me 01/29/23, he told me that his chest pain was thought to be gallstones.  It occurs more in the center of her chest.  He was told he might have to have his gallbladder removed at some point.  He was started on Jardiance and he stopped a few of his other medications.  He states they were stopped due to his kidney disease.  One of his medications close he is asked.  He does experience some dizziness from time to time but this is resolving.  Likely due to multiple medication changes.  He does need a nitroglycerin  refill.  He shares with me that his brother who is 79 years old just had triple bypass.  He drinks and smokes which hopefully he is going to  quit.  I saw him 03/2023, he tells me that he felt so bad when he went to the ER his heart was pounding.  His heart rate would go from 40 elevated up to 90  bpm and felt like it was pounding out of his chest.  Blood pressure was low and then all the way up to 200/100.  He states he felt like he was dying.  Troponin were negative for ischemic event.  The ER doctor told him he had GERD and he was told to avoid [employer].  Ever since then he has not had repeat of the symptoms.  He has had dizziness for years and was on Flomax  for his prostate.  He discontinued this medication a few days ago as well as his metoprolol  and he feels a lot better.  No dizziness.  No near syncope like he was having.  He tells me that he needs a referral today for a nephrologist and urologist as well as a primary care.  We reviewed his EKG and it appears that he has a new right bundle branch block.  We discussed pursuing a workup for more information.  He does have a strong family history of coronary artery disease and his brother just had a triple bypass.  He had a previous stent placed in the LAD back in 2022.  Reports no shortness of breath nor dyspnea on exertion. Reports no chest pain, pressure, or tightness. No edema, orthopnea, PND.   Today, he presents with coronary artery disease and chronic kidney disease who presents  for a cardiovascular follow-up.  His LDL has improved but triglycerides and A1c remain above goal, with A1c 6.5 six months ago and ongoing efforts to lower it.  He walks two miles nightly without limitation and notes improved exercise heart rate, which now stays below 86 compared with 118 previously.  He had severe diarrhea and GERD after the first two doses of Praluent requiring an ER visit, but these symptoms have resolved and he now tolerates the medication.  A PET CT scan was done in August and a recent EKG showed changes (RBBB).  He stopped Imdur , glipizide, and Zetia . He continues amlodipine , Cozaar, nitroglycerin  as needed, Maalox as needed, and levothyroxine , which was recently reduced from 150 mcg to 137 mcg.  He has no shortness of breath, lower  extremity swelling, palpitations, or unusual heartbeats.  Reports no shortness of breath nor dyspnea on exertion. Reports no chest pain, pressure, or tightness. No edema, orthopnea, PND. Reports no palpitations.   Discussed the use of AI scribe software for clinical note transcription with the patient, who gave verbal consent to proceed.   Past Medical History    Past Medical History:  Diagnosis Date   Coronary artery disease    Diabetes mellitus without complication (HCC)    Gout 06/06/2013   Hyperlipidemia LDL goal <70 06/06/2013   Hypertension    Hypothyroidism    Nephrolithiasis 06/06/2013   Non-ST elevation (NSTEMI) myocardial infarction (HCC) 12/24/2020   Past Surgical History:  Procedure Laterality Date   CORONARY STENT PLACEMENT     CORONARY/GRAFT ACUTE MI REVASCULARIZATION N/A 12/24/2020   Procedure: Coronary/Graft Acute MI Revascularization;  Surgeon: Burnard Debby LABOR, MD;  Location: [facility] INVASIVE CV LAB;  Service: Cardiovascular;  Laterality: N/A;   LEFT HEART CATH AND CORONARY ANGIOGRAPHY N/A 12/24/2020   Procedure: LEFT HEART CATH AND CORONARY ANGIOGRAPHY;  Surgeon: Burnard Debby LABOR, MD;  Location: [facility] INVASIVE CV LAB;  Service: Cardiovascular;  Laterality: N/A;    Allergies  Allergies  Allergen Reactions   Crestor  [Rosuvastatin ]     Myalgias on 20mg  dosing   Lipitor [Atorvastatin]     Muscle aches, fatigue, and weakness     EKGs/Labs/Other Studies Reviewed:   The following studies were reviewed today:  Prior cardiac catheterization 2/21: Diagnostic Dominance: Right     Intervention        EKG:  EKG is  ordered today.  The ekg ordered today demonstrates normal sinus rhythm, rate 73 bpm, first-degree AV block with PAC  Recent Labs: 08/09/2024: BUN 23; Creatinine, Ser 1.76; Hemoglobin 15.2; Platelets 152; Potassium 4.5; Sodium 143  Recent Lipid Panel    Component Value Date/Time   CHOL 199 04/11/2023 1408   TRIG 262 (H) 04/11/2023 1408   HDL 27 (L)  04/11/2023 1408   CHOLHDL 7.4 (H) 04/11/2023 1408   CHOLHDL 8.3 12/24/2020 1259   VLDL UNABLE TO CALCULATE IF TRIGLYCERIDE OVER 400 mg/dL 97/88/7977 8740   LDLCALC 125 (H) 04/11/2023 1408   LDLDIRECT 142.4 (H) 12/24/2020 1259    Home Medications   Current Meds  Medication Sig   Alirocumab 75 MG/ML SOAJ Inject 75 mg into the skin every 14 (fourteen) days.   alum & mag hydroxide-simeth (MAALOX MAX) 400-400-40 MG/5ML suspension Take 10 mLs by mouth every 6 (six) hours as needed for indigestion.   amLODipine  (NORVASC ) 5 MG tablet Take 5 mg by mouth daily.   febuxostat  (ULORIC ) 40 MG tablet Take 40 mg by mouth daily.   levothyroxine  (SYNTHROID ) 125 MCG tablet Take 125  mcg by mouth daily before breakfast.   losartan (COZAAR) 100 MG tablet Take 100 mg by mouth daily.   nitroGLYCERIN  (NITROSTAT ) 0.4 MG SL tablet Place 1 tablet (0.4 mg total) under the tongue every 5 (five) minutes x 3 doses as needed for chest pain.     Review of Systems      All other systems reviewed and are otherwise negative except as noted above.  Physical Exam    VS:  BP (!) 146/80   Pulse 70   Ht 5' 10 (1.778 m)   Wt 183 lb (83 kg)   SpO2 98%   BMI 26.26 kg/m  , BMI Body mass index is 26.26 kg/m.  Wt Readings from Last 3 Encounters:  10/07/24 183 lb (83 kg)  04/05/23 192 lb 3.2 oz (87.2 kg)  04/01/23 185 lb (83.9 kg)     GEN: Well nourished, well developed, in no acute distress. HEENT: normal. Neck: Supple, no JVD, carotid bruits, or masses. Cardiac: RRR, no murmurs, rubs, or gallops. No clubbing, cyanosis, edema.  Radials/PT 2+ and equal bilaterally.  Respiratory:  Respirations regular and unlabored, clear to auscultation bilaterally. GI: Soft, nontender, nondistended. MS: No deformity or atrophy. Skin: Warm and dry, no rash. Neuro:  Strength and sensation are intact. Psych: Normal affect.  Assessment & Plan    Atherosclerotic heart disease of native coronary artery with history of coronary  angioplasty Recent PET CT showed normal blood flow and cardiac function. EKG showed stable right bundle branch block. No symptoms of dyspnea, edema, palpitations, or arrhythmias. - Continue current management and monitoring.  Hyperlipidemia LDL controlled at 44 mg/dL. Triglycerides elevated, likely due to dietary intake. HDL low, likely hereditary but improvable with exercise. - Encouraged regular exercise to improve HDL levels. - Advised to avoid sweet tea and high-sugar beverages. - Recheck lipid panel in six months with fasting blood draw.  Type 2 diabetes mellitus Hemoglobin A1c above goal, indicating suboptimal glycemic control. Dietary changes and exercise routine established. - Continue dietary modifications and exercise routine. - Recheck hemoglobin A1c in six months.  Chronic kidney disease stage 3B Glipizide discontinued due to nephrotoxicity risk. Current management avoids nephrotoxic medications. - Continue to avoid medications that may affect renal function.  Hypothyroidism Levothyroxine  dose adjusted to 137 mcg. No recent thyroid function tests available. - Continue current levothyroxine  dose of 137 mcg. - Ensure regular monitoring of thyroid function by primary care.  Right bundle branch block Noted on previous EKGs, consistent with prior findings. No new symptoms or changes in cardiac status. - Continue monitoring cardiac status.   Disposition: Follow up 1 year with me or Dr. Jeffrie  Signed, Orren LOISE Fabry, PA-C 10/07/2024, 2:11 PM [facility]
